# Patient Record
Sex: Male | Born: 1965 | Race: White | Hispanic: No | Marital: Married | Smoking: Never smoker
Health system: Southern US, Community
[De-identification: ages and names within clinical notes are randomized; demographics above are authoritative.]

## PROBLEM LIST (undated history)

## (undated) DIAGNOSIS — I10 Essential (primary) hypertension: Secondary | ICD-10-CM

## (undated) DIAGNOSIS — J329 Chronic sinusitis, unspecified: Secondary | ICD-10-CM

## (undated) DIAGNOSIS — T7840XA Allergy, unspecified, initial encounter: Secondary | ICD-10-CM

## (undated) DIAGNOSIS — E7800 Pure hypercholesterolemia, unspecified: Secondary | ICD-10-CM

## (undated) DIAGNOSIS — D649 Anemia, unspecified: Secondary | ICD-10-CM

## (undated) HISTORY — DX: Anemia, unspecified: D64.9

## (undated) HISTORY — PX: KNEE ARTHROSCOPY W/ MENISCAL REPAIR: SHX1877

## (undated) HISTORY — DX: Chronic sinusitis, unspecified: J32.9

## (undated) HISTORY — PX: OTHER SURGICAL HISTORY: SHX169

## (undated) HISTORY — DX: Allergy, unspecified, initial encounter: T78.40XA

---

## 2013-04-22 ENCOUNTER — Emergency Department (HOSPITAL_BASED_OUTPATIENT_CLINIC_OR_DEPARTMENT_OTHER)
Admission: EM | Admit: 2013-04-22 | Discharge: 2013-04-22 | Disposition: A | Discharge status: Home / Self Care | Payer: BC Managed Care – PPO | Attending: Emergency Medicine | Admitting: Emergency Medicine

## 2013-04-22 ENCOUNTER — Encounter (HOSPITAL_BASED_OUTPATIENT_CLINIC_OR_DEPARTMENT_OTHER): Payer: Self-pay | Admitting: Emergency Medicine

## 2013-04-22 ENCOUNTER — Emergency Department (HOSPITAL_BASED_OUTPATIENT_CLINIC_OR_DEPARTMENT_OTHER): Payer: BC Managed Care – PPO

## 2013-04-22 DIAGNOSIS — M436 Torticollis: Secondary | ICD-10-CM

## 2013-04-22 DIAGNOSIS — Z8639 Personal history of other endocrine, nutritional and metabolic disease: Secondary | ICD-10-CM | POA: Insufficient documentation

## 2013-04-22 DIAGNOSIS — Z79899 Other long term (current) drug therapy: Secondary | ICD-10-CM | POA: Insufficient documentation

## 2013-04-22 DIAGNOSIS — R51 Headache: Secondary | ICD-10-CM | POA: Insufficient documentation

## 2013-04-22 DIAGNOSIS — Z862 Personal history of diseases of the blood and blood-forming organs and certain disorders involving the immune mechanism: Secondary | ICD-10-CM | POA: Insufficient documentation

## 2013-04-22 DIAGNOSIS — I10 Essential (primary) hypertension: Secondary | ICD-10-CM | POA: Insufficient documentation

## 2013-04-22 HISTORY — DX: Pure hypercholesterolemia, unspecified: E78.00

## 2013-04-22 HISTORY — DX: Essential (primary) hypertension: I10

## 2013-04-22 LAB — BASIC METABOLIC PANEL
BUN: 15 mg/dL (ref 6–23)
CALCIUM: 9.6 mg/dL (ref 8.4–10.5)
CO2: 24 mEq/L (ref 19–32)
Chloride: 102 mEq/L (ref 96–112)
Creatinine, Ser: 0.9 mg/dL (ref 0.50–1.35)
Glucose, Bld: 120 mg/dL — ABNORMAL HIGH (ref 70–99)
POTASSIUM: 3.8 meq/L (ref 3.7–5.3)
Sodium: 141 mEq/L (ref 137–147)

## 2013-04-22 MED ORDER — LISINOPRIL 10 MG PO TABS
10.0000 mg | ORAL_TABLET | Freq: Once | ORAL | Status: AC
  Administered 2013-04-22: 10 mg via ORAL
  Filled 2013-04-22: qty 1

## 2013-04-22 MED ORDER — LISINOPRIL 20 MG PO TABS: 20.0000 mg | ORAL_TABLET | Freq: Every day | ORAL | Status: DC

## 2013-04-22 MED ORDER — METHOCARBAMOL 500 MG PO TABS: 500.0000 mg | ORAL_TABLET | Freq: Two times a day (BID) | ORAL | Status: DC

## 2013-04-22 MED ORDER — LISINOPRIL-HYDROCHLOROTHIAZIDE 20-12.5 MG PO TABS: 1.0000 | ORAL_TABLET | Freq: Two times a day (BID) | ORAL | Status: DC

## 2013-04-22 NOTE — Discharge Instructions (Signed)
DASH Diet  The DASH diet stands for "Dietary Approaches to Stop Hypertension." It is a healthy eating plan that has been shown to reduce high blood pressure (hypertension) in as little as 14 days, while also possibly providing other significant health benefits. These other health benefits include reducing the risk of breast cancer after menopause and reducing the risk of type 2 diabetes, heart disease, colon cancer, and stroke. Health benefits also include weight loss and slowing kidney failure in patients with chronic kidney disease.   DIET GUIDELINES  · Limit salt (sodium). Your diet should contain less than 1500 mg of sodium daily.  · Limit refined or processed carbohydrates. Your diet should include mostly whole grains. Desserts and added sugars should be used sparingly.  · Include small amounts of heart-healthy fats. These types of fats include nuts, oils, and tub margarine. Limit saturated and trans fats. These fats have been shown to be harmful in the body.  CHOOSING FOODS   The following food groups are based on a 2000 calorie diet. See your Registered Dietitian for individual calorie needs.  Grains and Grain Products (6 to 8 servings daily)  · Eat More Often: Whole-wheat bread, brown rice, whole-grain or wheat pasta, quinoa, popcorn without added fat or salt (air popped).  · Eat Less Often: White bread, white pasta, white rice, cornbread.  Vegetables (4 to 5 servings daily)  · Eat More Often: Fresh, frozen, and canned vegetables. Vegetables may be raw, steamed, roasted, or grilled with a minimal amount of fat.  · Eat Less Often/Avoid: Creamed or fried vegetables. Vegetables in a cheese sauce.  Fruit (4 to 5 servings daily)  · Eat More Often: All fresh, canned (in natural juice), or frozen fruits. Dried fruits without added sugar. One hundred percent fruit juice (½ cup [237 mL] daily).  · Eat Less Often: Dried fruits with added sugar. Canned fruit in light or heavy syrup.  Lean Meats, Fish, and Poultry (2  servings or less daily. One serving is 3 to 4 oz [85-114 g]).  · Eat More Often: Ninety percent or leaner ground beef, tenderloin, sirloin. Round cuts of beef, chicken breast, turkey breast. All fish. Grill, bake, or broil your meat. Nothing should be fried.  · Eat Less Often/Avoid: Fatty cuts of meat, turkey, or chicken leg, thigh, or wing. Fried cuts of meat or fish.  Dairy (2 to 3 servings)  · Eat More Often: Low-fat or fat-free milk, low-fat plain or light yogurt, reduced-fat or part-skim cheese.  · Eat Less Often/Avoid: Milk (whole, 2%). Whole milk yogurt. Full-fat cheeses.  Nuts, Seeds, and Legumes (4 to 5 servings per week)  · Eat More Often: All without added salt.  · Eat Less Often/Avoid: Salted nuts and seeds, canned beans with added salt.  Fats and Sweets (limited)  · Eat More Often: Vegetable oils, tub margarines without trans fats, sugar-free gelatin. Mayonnaise and salad dressings.  · Eat Less Often/Avoid: Coconut oils, palm oils, butter, stick margarine, cream, half and half, cookies, candy, pie.  FOR MORE INFORMATION  The Dash Diet Eating Plan: www.dashdiet.org  Document Released: 03/12/2011 Document Revised: 06/15/2011 Document Reviewed: 03/12/2011  [employer]® Patient Information ©2014 [employer].

## 2013-04-22 NOTE — ED Notes (Signed)
Pt states he has been out of his b/p meds x 45 days. Has had headache x 3 days

## 2013-04-22 NOTE — ED Provider Notes (Signed)
CSN: [id number]     Arrival date & time 04/22/13  1651 History   None    Chief Complaint  Patient presents with  . Headache  . Hypertension   (Consider location/radiation/quality/duration/timing/severity/associated sxs/prior Treatment) HPI William Shea is a 48 y.o. male who presents to the ED with elevated BP and a headache. He states that he has been out of his medication for over a month and has had a headache x 3 days. Pain started on the left side of his neck when he woke 3 days ago and has continued. He denies dizziness, nausea, vomiting, weakness or other problems.   Past Medical History  Diagnosis Date  . Hypertension   . High cholesterol    History reviewed. No pertinent past surgical history. No family history on file. History  Substance Use Topics  . Smoking status: Never Smoker   . Smokeless tobacco: Never Used  . Alcohol Use: 4.8 oz/week    8 Cans of beer per week    Review of Systems  Constitutional: Negative for fever and chills.  Eyes: Negative for redness and visual disturbance.  Respiratory: Negative for cough and shortness of breath.   Cardiovascular: Negative for chest pain.  Gastrointestinal: Negative for nausea, vomiting and abdominal pain.  Musculoskeletal: Positive for neck pain. Negative for myalgias and neck stiffness.  Skin: Negative for rash.  Neurological: Positive for headaches. Negative for dizziness, facial asymmetry and weakness.  Psychiatric/Behavioral: Negative for confusion. The patient is not nervous/anxious.     Allergies  Review of patient's allergies indicates no known allergies.  Home Medications   Current Outpatient Rx  Name  Route  Sig  Dispense  Refill  . lisinopril-hydrochlorothiazide (PRINZIDE,ZESTORETIC) 20-12.5 MG per tablet   Oral   Take 1 tablet by mouth 2 (two) times daily.          BP 168/108  Pulse 64  Temp(Src) 98 F (36.7 C) (Oral)  Resp 20  Ht 6' (1.829 m)  Wt 200 lb (90.719 kg)  BMI 27.12 kg/m2  SpO2  98% Physical Exam  Nursing note and vitals reviewed. Constitutional: He is oriented to person, place, and time. He appears well-developed and well-nourished.  Patient appears healthy, alert and in no distress.  HENT:  Head: Normocephalic and atraumatic.  Right Ear: Tympanic membrane normal.  Left Ear: Tympanic membrane normal.  Nose: Nose normal.  Mouth/Throat: Uvula is midline, oropharynx is clear and moist and mucous membranes are normal.  Eyes: EOM are normal.  Neck: Trachea normal and normal range of motion. Neck supple. No JVD present. Muscular tenderness present. No rigidity. Normal range of motion present.    Muscular tenderness left side of neck with palpation and range of motion. Muscle spasm noted.  Cardiovascular: Normal rate, regular rhythm and normal heart sounds.   Pulmonary/Chest: Effort normal and breath sounds normal.  Abdominal: Soft. There is no tenderness.  Musculoskeletal: Normal range of motion.  Neurological: He is alert and oriented to person, place, and time. He has normal strength and normal reflexes. No cranial nerve deficit or sensory deficit. He displays a negative Romberg sign. Coordination and gait normal.  Skin: Skin is warm and dry.  Psychiatric: He has a normal mood and affect. His behavior is normal.       Dr. Wyvonnia Dusky in to see the patient and request labs and Head CT. If normal will give Rx for his HTN and resource information so he can get established with a PCP.    Results for orders  placed during the hospital encounter of 04/22/13 (from the past 24 hour(s))  BASIC METABOLIC PANEL     Status: Abnormal   Collection Time    04/22/13  6:20 PM      Result Value Range   Sodium 141  137 - 147 mEq/L   Potassium 3.8  3.7 - 5.3 mEq/L   Chloride 102  96 - 112 mEq/L   CO2 24  19 - 32 mEq/L   Glucose, Bld 120 (*) 70 - 99 mg/dL   BUN 15  6 - 23 mg/dL   Creatinine, Ser 0.90  0.50 - 1.35 mg/dL   Calcium 9.6  8.4 - 10.5 mg/dL   GFR calc non Af Amer >90   >90 mL/min   GFR calc Af Amer >90  >90 mL/min    Ct Head Wo Contrast  04/22/2013   CLINICAL DATA:  HEADACHE HYPERTENSION HEADACHE HYPERTENSION  EXAM: CT HEAD WITHOUT CONTRAST  TECHNIQUE: Contiguous axial images were obtained from the base of the skull through the vertex without intravenous contrast.  COMPARISON:  None.  FINDINGS: No acute intracranial hemorrhage. No focal mass lesion. No CT evidence of acute infarction. No midline shift or mass effect. No hydrocephalus. Basilar cisterns are patent. Paranasal sinuses and mastoid air cells are clear.  IMPRESSION: Normal head CT.   Electronically Signed   By: Suzy Bouchard M.D.   On: 04/22/2013 19:27    ED Course  Procedures Lisinopril dose given in the ED  MDM  48 y.o. male with headache x 3 days and elevated BP and muscular pain of left side of his neck. Out of medication x 45 days. Will refill medications and treat torticollis. Patient given resource information to find a PCP. I have reviewed this patient's vital signs, nurses notes, appropriate labs and imaging.  I have discussed findings with the patient and plan of care. Discussed complications associated with untreated hypertension. He voices understanding.  Stable for discharge with vital signs repeated and BP improved. BP 172/105  Pulse 61  Temp(Src) 97.7 F (36.5 C) (Oral)  Resp 16  Ht 6' (1.829 m)  Wt 200 lb (90.719 kg)  BMI 27.12 kg/m2  SpO2 99%    Medication List         lisinopril-hydrochlorothiazide 20-12.5 MG per tablet  Commonly known as:  PRINZIDE,ZESTORETIC  Take 1 tablet by mouth 2 (two) times daily.     methocarbamol 500 MG tablet  Commonly known as:  ROBAXIN  Take 1 tablet (500 mg total) by mouth 2 (two) times daily.           [place], [place] 04/23/13 2146

## 2013-04-23 NOTE — ED Provider Notes (Signed)
Medical screening examination/treatment/procedure(s) were conducted as a shared visit with non-physician practitioner(s) and myself.  I personally evaluated the patient during the encounter.  Gradual onset headache for the past 3 days. Out of pressure medications for the past month. No chest pain or shortness of breath.  CN 2-12 intact, no ataxia on finger to nose, no nystagmus, 5/5 strength throughout, no pronator drift, Romberg negative, normal gait.   EKG Interpretation   None          Ezequiel Essex, MD 04/23/13 2351

## 2013-07-05 ENCOUNTER — Ambulatory Visit: Payer: BC Managed Care – PPO | Admitting: Physician Assistant

## 2013-07-14 ENCOUNTER — Ambulatory Visit: Payer: BC Managed Care – PPO | Admitting: Physician Assistant

## 2013-07-19 ENCOUNTER — Telehealth: Payer: Self-pay | Admitting: Physician Assistant

## 2013-07-19 ENCOUNTER — Encounter: Payer: Self-pay | Admitting: Physician Assistant

## 2013-07-19 ENCOUNTER — Ambulatory Visit (INDEPENDENT_AMBULATORY_CARE_PROVIDER_SITE_OTHER): Payer: BC Managed Care – PPO | Admitting: Physician Assistant

## 2013-07-19 VITALS — BP 128/94 | HR 76 | Temp 98.2°F | Resp 16 | Ht 72.0 in | Wt 208.2 lb

## 2013-07-19 DIAGNOSIS — I10 Essential (primary) hypertension: Secondary | ICD-10-CM | POA: Insufficient documentation

## 2013-07-19 DIAGNOSIS — F411 Generalized anxiety disorder: Secondary | ICD-10-CM | POA: Insufficient documentation

## 2013-07-19 DIAGNOSIS — Z0000 Encounter for general adult medical examination without abnormal findings: Secondary | ICD-10-CM | POA: Insufficient documentation

## 2013-07-19 DIAGNOSIS — Z23 Encounter for immunization: Secondary | ICD-10-CM

## 2013-07-19 HISTORY — DX: Encounter for general adult medical examination without abnormal findings: Z00.00

## 2013-07-19 LAB — TSH: TSH: 1.745 u[IU]/mL (ref 0.350–4.500)

## 2013-07-19 LAB — HEPATIC FUNCTION PANEL
ALK PHOS: 91 U/L (ref 39–117)
ALT: 112 U/L — ABNORMAL HIGH (ref 0–53)
AST: 87 U/L — ABNORMAL HIGH (ref 0–37)
Albumin: 4.9 g/dL (ref 3.5–5.2)
BILIRUBIN INDIRECT: 0.5 mg/dL (ref 0.2–1.2)
Bilirubin, Direct: 0.1 mg/dL (ref 0.0–0.3)
TOTAL PROTEIN: 7.5 g/dL (ref 6.0–8.3)
Total Bilirubin: 0.6 mg/dL (ref 0.2–1.2)

## 2013-07-19 LAB — CBC WITH DIFFERENTIAL/PLATELET
Basophils Absolute: 0.1 10*3/uL (ref 0.0–0.1)
Basophils Relative: 1 % (ref 0–1)
EOS ABS: 0.4 10*3/uL (ref 0.0–0.7)
Eosinophils Relative: 5 % (ref 0–5)
HCT: 44.7 % (ref 39.0–52.0)
Hemoglobin: 16.1 g/dL (ref 13.0–17.0)
LYMPHS ABS: 1.9 10*3/uL (ref 0.7–4.0)
Lymphocytes Relative: 25 % (ref 12–46)
MCH: 31 pg (ref 26.0–34.0)
MCHC: 36 g/dL (ref 30.0–36.0)
MCV: 86.1 fL (ref 78.0–100.0)
Monocytes Absolute: 1 10*3/uL (ref 0.1–1.0)
Monocytes Relative: 13 % — ABNORMAL HIGH (ref 3–12)
NEUTROS ABS: 4.2 10*3/uL (ref 1.7–7.7)
NEUTROS PCT: 56 % (ref 43–77)
Platelets: 344 10*3/uL (ref 150–400)
RBC: 5.19 MIL/uL (ref 4.22–5.81)
RDW: 13.7 % (ref 11.5–15.5)
WBC: 7.5 10*3/uL (ref 4.0–10.5)

## 2013-07-19 LAB — HEMOGLOBIN A1C
Hgb A1c MFr Bld: 6.2 % — ABNORMAL HIGH (ref <5.7)
Mean Plasma Glucose: 131 mg/dL — ABNORMAL HIGH (ref <117)

## 2013-07-19 LAB — LIPID PANEL
CHOLESTEROL: 325 mg/dL — AB (ref 0–200)
HDL: 49 mg/dL (ref >39)
LDL Cholesterol: 208 mg/dL — ABNORMAL HIGH (ref 0–99)
TRIGLYCERIDES: 339 mg/dL — AB (ref <150)
Total CHOL/HDL Ratio: 6.6 Ratio
VLDL: 68 mg/dL — ABNORMAL HIGH (ref 0–40)

## 2013-07-19 LAB — BASIC METABOLIC PANEL
BUN: 12 mg/dL (ref 6–23)
CO2: 30 meq/L (ref 19–32)
Calcium: 9.8 mg/dL (ref 8.4–10.5)
Chloride: 95 mEq/L — ABNORMAL LOW (ref 96–112)
Creat: 0.88 mg/dL (ref 0.50–1.35)
Glucose, Bld: 113 mg/dL — ABNORMAL HIGH (ref 70–99)
POTASSIUM: 4.6 meq/L (ref 3.5–5.3)
SODIUM: 132 meq/L — AB (ref 135–145)

## 2013-07-19 MED ORDER — ESCITALOPRAM OXALATE 20 MG PO TABS: ORAL_TABLET | ORAL | Status: DC

## 2013-07-19 MED ORDER — LISINOPRIL-HYDROCHLOROTHIAZIDE 20-12.5 MG PO TABS: 1.0000 | ORAL_TABLET | Freq: Two times a day (BID) | ORAL | Status: DC

## 2013-07-19 NOTE — Patient Instructions (Signed)
Please obtain labs.  I will call you with your results.  Continue blood pressure medication as directed.  For irritability, please start Lexapro -- 1/2 tablet daily for 1 week.  Then increase to 1 tablet daily.  Follow-up in 1 month.  Return sooner if you need anything.  Preventive Care for Adults, Male A healthy lifestyle and preventive care can promote health and wellness. Preventive health guidelines for men include the following key practices:  A routine yearly physical is a good way to check with your health care provider about your health and preventative screening. It is a chance to share any concerns and updates on your health and to receive a thorough exam.  Visit your dentist for a routine exam and preventative care every 6 months. Brush your teeth twice a day and floss once a day. Good oral hygiene prevents tooth decay and gum disease.  The frequency of eye exams is based on your age, health, family medical history, use of contact lenses, and other factors. Follow your health care provider's recommendations for frequency of eye exams.  Eat a healthy diet. Foods such as vegetables, fruits, whole grains, low-fat dairy products, and lean protein foods contain the nutrients you need without too many calories. Decrease your intake of foods high in solid fats, added sugars, and salt. Eat the right amount of calories for you.Get information about a proper diet from your health care provider, if necessary.  Regular physical exercise is one of the most important things you can do for your health. Most adults should get at least 150 minutes of moderate-intensity exercise (any activity that increases your heart rate and causes you to sweat) each week. In addition, most adults need muscle-strengthening exercises on 2 or more days a week.  Maintain a healthy weight. The body mass index (BMI) is a screening tool to identify possible weight problems. It provides an estimate of body fat based on height  and weight. Your health care provider can find your BMI and can help you achieve or maintain a healthy weight.For adults 20 years and older:  A BMI below 18.5 is considered underweight.  A BMI of 18.5 to 24.9 is normal.  A BMI of 25 to 29.9 is considered overweight.  A BMI of 30 and above is considered obese.  Maintain normal blood lipids and cholesterol levels by exercising and minimizing your intake of saturated fat. Eat a balanced diet with plenty of fruit and vegetables. Blood tests for lipids and cholesterol should begin at age 20 and be repeated every 5 years. If your lipid or cholesterol levels are high, you are over 50, or you are at high risk for heart disease, you may need your cholesterol levels checked more frequently.Ongoing high lipid and cholesterol levels should be treated with medicines if diet and exercise are not working.  If you smoke, find out from your health care provider how to quit. If you do not use tobacco, do not start.  Lung cancer screening is recommended for adults aged 52 80 years who are at high risk for developing lung cancer because of a history of smoking. A yearly low-dose CT scan of the lungs is recommended for people who have at least a 30-pack-year history of smoking and are a current smoker or have quit within the past 15 years. A pack year of smoking is smoking an average of 1 pack of cigarettes a day for 1 year (for example: 1 pack a day for 30 years or 2 packs  a day for 15 years). Yearly screening should continue until the smoker has stopped smoking for at least 15 years. Yearly screening should be stopped for people who develop a health problem that would prevent them from having lung cancer treatment.  If you choose to drink alcohol, do not have more than 2 drinks per day. One drink is considered to be 12 ounces (355 mL) of beer, 5 ounces (148 mL) of wine, or 1.5 ounces (44 mL) of liquor.  Avoid use of street drugs. Do not share needles with anyone.  Ask for help if you need support or instructions about stopping the use of drugs.  High blood pressure causes heart disease and increases the risk of stroke. Your blood pressure should be checked at least every 1 2 years. Ongoing high blood pressure should be treated with medicines, if weight loss and exercise are not effective.  If you are 28 50 years old, ask your health care provider if you should take aspirin to prevent heart disease.  Diabetes screening involves taking a blood sample to check your fasting blood sugar level. This should be done once every 3 years, after age 39, if you are within normal weight and without risk factors for diabetes. Testing should be considered at a younger age or be carried out more frequently if you are overweight and have at least 1 risk factor for diabetes.  Colorectal cancer can be detected and often prevented. Most routine colorectal cancer screening begins at the age of 8 and continues through age 29. However, your health care provider may recommend screening at an earlier age if you have risk factors for colon cancer. On a yearly basis, your health care provider may provide home test kits to check for hidden blood in the stool. Use of a small camera at the end of a tube to directly examine the colon (sigmoidoscopy or colonoscopy) can detect the earliest forms of colorectal cancer. Talk to your health care provider about this at age 19, when routine screening begins. Direct exam of the colon should be repeated every 5 10 years through age 45, unless early forms of precancerous polyps or small growths are found.  People who are at an increased risk for hepatitis B should be screened for this virus. You are considered at high risk for hepatitis B if:  You were born in a country where hepatitis B occurs often. Talk with your health care provider about which countries are considered high-risk.  Your parents were born in a high-risk country and you have not  received a shot to protect against hepatitis B (hepatitis B vaccine).  You have HIV or AIDS.  You use needles to inject street drugs.  You live with, or have sex with, someone who has hepatitis B.  You are a man who has sex with other men (MSM).  You get hemodialysis treatment.  You take certain medicines for conditions such as cancer, organ transplantation, and autoimmune conditions.  Hepatitis C blood testing is recommended for all people born from [birth year] through 1965 and any individual with known risks for hepatitis C.  Practice safe sex. Use condoms and avoid high-risk sexual practices to reduce the spread of sexually transmitted infections (STIs). STIs include gonorrhea, chlamydia, syphilis, trichomonas, herpes, HPV, and human immunodeficiency virus (HIV). Herpes, HIV, and HPV are viral illnesses that have no cure. They can result in disability, cancer, and death.  A one-time screening for abdominal aortic aneurysm (AAA) and surgical repair of large AAAs by  ultrasound are recommended for men ages 53 to 49 years who are current or former smokers.  Healthy men should no longer receive prostate-specific antigen (PSA) blood tests as part of routine cancer screening. Talk with your health care provider about prostate cancer screening.  Testicular cancer screening is not recommended for adult males who have no symptoms. Screening includes self-exam, a health care provider exam, and other screening tests. Consult with your health care provider about any symptoms you have or any concerns you have about testicular cancer.  Use sunscreen. Apply sunscreen liberally and repeatedly throughout the day. You should seek shade when your shadow is shorter than you. Protect yourself by wearing long sleeves, pants, a wide-brimmed hat, and sunglasses year round, whenever you are outdoors.  Once a month, do a whole-body skin exam, using a mirror to look at the skin on your back. Tell your health care provider  about new moles, moles that have irregular borders, moles that are larger than a pencil eraser, or moles that have changed in shape or color.  Stay current with required vaccines (immunizations).  Influenza vaccine. All adults should be immunized every year.  Tetanus, diphtheria, and acellular pertussis (Td, Tdap) vaccine. An adult who has not previously received Tdap or who does not know his vaccine status should receive 1 dose of Tdap. This initial dose should be followed by tetanus and diphtheria toxoids (Td) booster doses every 10 years. Adults with an unknown or incomplete history of completing a 3-dose immunization series with Td-containing vaccines should begin or complete a primary immunization series including a Tdap dose. Adults should receive a Td booster every 10 years.  Varicella vaccine. An adult without evidence of immunity to varicella should receive 2 doses or a second dose if he has previously received 1 dose.  Human papillomavirus (HPV) vaccine. Males aged 71 21 years who have not received the vaccine previously should receive the 3-dose series. Males aged 6 26 years may be immunized. Immunization is recommended through the age of 24 years for any male who has sex with males and did not get any or all doses earlier. Immunization is recommended for any person with an immunocompromised condition through the age of 66 years if he did not get any or all doses earlier. During the 3-dose series, the second dose should be obtained 4 8 weeks after the first dose. The third dose should be obtained 24 weeks after the first dose and 16 weeks after the second dose.  Zoster vaccine. One dose is recommended for adults aged 29 years or older unless certain conditions are present.  Measles, mumps, and rubella (MMR) vaccine. Adults born before [birth year] generally are considered immune to measles and mumps. Adults born in [birth year] or later should have 1 or more doses of MMR vaccine unless there is a  contraindication to the vaccine or there is laboratory evidence of immunity to each of the three diseases. A routine second dose of MMR vaccine should be obtained at least 28 days after the first dose for students attending postsecondary schools, health care workers, or international travelers. People who received inactivated measles vaccine or an unknown type of measles vaccine during 1963 1967 should receive 2 doses of MMR vaccine. People who received inactivated mumps vaccine or an unknown type of mumps vaccine before 1979 and are at high risk for mumps infection should consider immunization with 2 doses of MMR vaccine. Unvaccinated health care workers born before 1957 who lack laboratory evidence of measles, mumps, or  rubella immunity or laboratory confirmation of disease should consider measles and mumps immunization with 2 doses of MMR vaccine or rubella immunization with 1 dose of MMR vaccine.  Pneumococcal 13-valent conjugate (PCV13) vaccine. When indicated, a person who is uncertain of his immunization history and has no record of immunization should receive the PCV13 vaccine. An adult aged 54 years or older who has certain medical conditions and has not been previously immunized should receive 1 dose of PCV13 vaccine. This PCV13 should be followed with a dose of pneumococcal polysaccharide (PPSV23) vaccine. The PPSV23 vaccine dose should be obtained at least 8 weeks after the dose of PCV13 vaccine. An adult aged 53 years or older who has certain medical conditions and previously received 1 or more doses of PPSV23 vaccine should receive 1 dose of PCV13. The PCV13 vaccine dose should be obtained 1 or more years after the last PPSV23 vaccine dose.  Pneumococcal polysaccharide (PPSV23) vaccine. When PCV13 is also indicated, PCV13 should be obtained first. All adults aged 32 years and older should be immunized. An adult younger than age 90 years who has certain medical conditions should be immunized. Any  person who resides in a nursing home or long-term care facility should be immunized. An adult smoker should be immunized. People with an immunocompromised condition and certain other conditions should receive both PCV13 and PPSV23 vaccines. People with human immunodeficiency virus (HIV) infection should be immunized as soon as possible after diagnosis. Immunization during chemotherapy or radiation therapy should be avoided. Routine use of PPSV23 vaccine is not recommended for American Indians, [place] Natives, or people younger than 65 years unless there are medical conditions that require PPSV23 vaccine. When indicated, people who have unknown immunization and have no record of immunization should receive PPSV23 vaccine. One-time revaccination 5 years after the first dose of PPSV23 is recommended for people aged 40 64 years who have chronic kidney failure, nephrotic syndrome, asplenia, or immunocompromised conditions. People who received 1 2 doses of PPSV23 before age 44 years should receive another dose of PPSV23 vaccine at age 16 years or later if at least 5 years have passed since the previous dose. Doses of PPSV23 are not needed for people immunized with PPSV23 at or after age 19 years.  Meningococcal vaccine. Adults with asplenia or persistent complement component deficiencies should receive 2 doses of quadrivalent meningococcal conjugate (MenACWY-D) vaccine. The doses should be obtained at least 2 months apart. Microbiologists working with certain meningococcal bacteria, [employer] recruits, people at risk during an outbreak, and people who travel to or live in countries with a high rate of meningitis should be immunized. A first-year college student up through age 17 years who is living in a residence hall should receive a dose if he did not receive a dose on or after his 16th birthday. Adults who have certain high-risk conditions should receive one or more doses of vaccine.  Hepatitis A vaccine. Adults who  wish to be protected from this disease, have certain high-risk conditions, work with hepatitis A-infected animals, work in hepatitis A research labs, or travel to or work in countries with a high rate of hepatitis A should be immunized. Adults who were previously unvaccinated and who anticipate close contact with an international adoptee during the first 60 days after arrival in the [place] from a country with a high rate of hepatitis A should be immunized.  Hepatitis B vaccine. Adults who wish to be protected from this disease, have certain high-risk conditions, may be exposed  to blood or other infectious body fluids, are household contacts or sex partners of hepatitis B positive people, are clients or workers in certain care facilities, or travel to or work in countries with a high rate of hepatitis B should be immunized.  Haemophilus influenzae type b (Hib) vaccine. A previously unvaccinated person with asplenia or sickle cell disease or having a scheduled splenectomy should receive 1 dose of Hib vaccine. Regardless of previous immunization, a recipient of a hematopoietic stem cell transplant should receive a 3-dose series 6 12 months after his successful transplant. Hib vaccine is not recommended for adults with HIV infection. Preventive Service / Frequency Ages 52 to 64  Blood pressure check.** / Every 1 to 2 years.  Lipid and cholesterol check.** / Every 5 years beginning at age 51.  Hepatitis C blood test.** / For any individual with known risks for hepatitis C.  Skin self-exam. / Monthly.  Influenza vaccine. / Every year.  Tetanus, diphtheria, and acellular pertussis (Tdap, Td) vaccine.** / Consult your health care provider. 1 dose of Td every 10 years.  Varicella vaccine.** / Consult your health care provider.  HPV vaccine. / 3 doses over 6 months, if 70 or younger.  Measles, mumps, rubella (MMR) vaccine.** / You need at least 1 dose of MMR if you were born in 1957 or later.  You may also need a second dose.  Pneumococcal 13-valent conjugate (PCV13) vaccine.** / Consult your health care provider.  Pneumococcal polysaccharide (PPSV23) vaccine.** / 1 to 2 doses if you smoke cigarettes or if you have certain conditions.  Meningococcal vaccine.** / 1 dose if you are age 32 to 69 years and a Market researcher living in a residence hall, or have one of several medical conditions. You may also need additional booster doses.  Hepatitis A vaccine.** / Consult your health care provider.  Hepatitis B vaccine.** / Consult your health care provider.  Haemophilus influenzae type b (Hib) vaccine.** / Consult your health care provider. Ages 44 to 68  Blood pressure check.** / Every 1 to 2 years.  Lipid and cholesterol check.** / Every 5 years beginning at age 55.  Lung cancer screening. / Every year if you are aged 54 80 years and have a 30-pack-year history of smoking and currently smoke or have quit within the past 15 years. Yearly screening is stopped once you have quit smoking for at least 15 years or develop a health problem that would prevent you from having lung cancer treatment.  Fecal occult blood test (FOBT) of stool. / Every year beginning at age 37 and continuing until age 41. You may not have to do this test if you get a colonoscopy every 10 years.  Flexible sigmoidoscopy** or colonoscopy.** / Every 5 years for a flexible sigmoidoscopy or every 10 years for a colonoscopy beginning at age 22 and continuing until age 47.  Hepatitis C blood test.** / For all people born from [birth year] through 1967 and any individual with known risks for hepatitis C.  Skin self-exam. / Monthly.  Influenza vaccine. / Every year.  Tetanus, diphtheria, and acellular pertussis (Tdap/Td) vaccine.** / Consult your health care provider. 1 dose of Td every 10 years.  Varicella vaccine.** / Consult your health care provider.  Zoster vaccine.** / 1 dose for adults aged 60 years or  older.  Measles, mumps, rubella (MMR) vaccine.** / You need at least 1 dose of MMR if you were born in 1957 or later. You may also need a second  dose.  Pneumococcal 13-valent conjugate (PCV13) vaccine.** / Consult your health care provider.  Pneumococcal polysaccharide (PPSV23) vaccine.** / 1 to 2 doses if you smoke cigarettes or if you have certain conditions.  Meningococcal vaccine.** / Consult your health care provider.  Hepatitis A vaccine.** / Consult your health care provider.  Hepatitis B vaccine.** / Consult your health care provider.  Haemophilus influenzae type b (Hib) vaccine.** / Consult your health care provider. Ages 59 and over  Blood pressure check.** / Every 1 to 2 years.  Lipid and cholesterol check.**/ Every 5 years beginning at age 32.  Lung cancer screening. / Every year if you are aged 48 80 years and have a 30-pack-year history of smoking and currently smoke or have quit within the past 15 years. Yearly screening is stopped once you have quit smoking for at least 15 years or develop a health problem that would prevent you from having lung cancer treatment.  Fecal occult blood test (FOBT) of stool. / Every year beginning at age 18 and continuing until age 48. You may not have to do this test if you get a colonoscopy every 10 years.  Flexible sigmoidoscopy** or colonoscopy.** / Every 5 years for a flexible sigmoidoscopy or every 10 years for a colonoscopy beginning at age 4 and continuing until age 55.  Hepatitis C blood test.** / For all people born from [birth year] through 1967 and any individual with known risks for hepatitis C.  Abdominal aortic aneurysm (AAA) screening.** / A one-time screening for ages 39 to 37 years who are current or former smokers.  Skin self-exam. / Monthly.  Influenza vaccine. / Every year.  Tetanus, diphtheria, and acellular pertussis (Tdap/Td) vaccine.** / 1 dose of Td every 10 years.  Varicella vaccine.** / Consult your health care  provider.  Zoster vaccine.** / 1 dose for adults aged 46 years or older.  Pneumococcal 13-valent conjugate (PCV13) vaccine.** / Consult your health care provider.  Pneumococcal polysaccharide (PPSV23) vaccine.** / 1 dose for all adults aged 22 years and older.  Meningococcal vaccine.** / Consult your health care provider.  Hepatitis A vaccine.** / Consult your health care provider.  Hepatitis B vaccine.** / Consult your health care provider.  Haemophilus influenzae type b (Hib) vaccine.** / Consult your health care provider. **Family history and personal history of risk and conditions may change your health care provider's recommendations. Document Released: 05/19/2001 Document Revised: 01/11/2013 Document Reviewed: 08/18/2010 [facility] Patient Information 2014 [place], [place].  Hypertension As your heart beats, it forces blood through your arteries. This force is your blood pressure. If the pressure is too high, it is called hypertension (HTN) or high blood pressure. HTN is dangerous because you may have it and not know it. High blood pressure may mean that your heart has to work harder to pump blood. Your arteries may be narrow or stiff. The extra work puts you at risk for heart disease, stroke, and other problems.  Blood pressure consists of two numbers, a higher number over a lower, 110/72, for example. It is stated as "110 over 72." The ideal is below 120 for the top number (systolic) and under 80 for the bottom (diastolic). Write down your blood pressure today. You should pay close attention to your blood pressure if you have certain conditions such as:  Heart failure.  Prior heart attack.  Diabetes  Chronic kidney disease.  Prior stroke.  Multiple risk factors for heart disease. To see if you have HTN, your blood pressure should be measured  while you are seated with your arm held at the level of the heart. It should be measured at least twice. A one-time elevated blood  pressure reading (especially in the Emergency Department) does not mean that you need treatment. There may be conditions in which the blood pressure is different between your right and left arms. It is important to see your caregiver soon for a recheck. Most people have essential hypertension which means that there is not a specific cause. This type of high blood pressure may be lowered by changing lifestyle factors such as:  Stress.  Smoking.  Lack of exercise.  Excessive weight.  Drug/tobacco/alcohol use.  Eating less salt. Most people do not have symptoms from high blood pressure until it has caused damage to the body. Effective treatment can often prevent, delay or reduce that damage. TREATMENT  When a cause has been identified, treatment for high blood pressure is directed at the cause. There are a large number of medications to treat HTN. These fall into several categories, and your caregiver will help you select the medicines that are best for you. Medications may have side effects. You should review side effects with your caregiver. If your blood pressure stays high after you have made lifestyle changes or started on medicines,   Your medication(s) may need to be changed.  Other problems may need to be addressed.  Be certain you understand your prescriptions, and know how and when to take your medicine.  Be sure to follow up with your caregiver within the time frame advised (usually within two weeks) to have your blood pressure rechecked and to review your medications.  If you are taking more than one medicine to lower your blood pressure, make sure you know how and at what times they should be taken. Taking two medicines at the same time can result in blood pressure that is too low. SEEK IMMEDIATE MEDICAL CARE IF:  You develop a severe headache, blurred or changing vision, or confusion.  You have unusual weakness or numbness, or a faint feeling.  You have severe chest or  abdominal pain, vomiting, or breathing problems. MAKE SURE YOU:   Understand these instructions.  Will watch your condition.  Will get help right away if you are not doing well or get worse. Document Released: 03/23/2005 Document Revised: 06/15/2011 Document Reviewed: 11/11/2007 [facility] Patient Information 2014 [place].

## 2013-07-19 NOTE — Assessment & Plan Note (Signed)
History reviewed.  Will obtain fasting labs.  Will be due for Colonoscopy in June of this year.  DTP given by nursing.

## 2013-07-19 NOTE — Progress Notes (Signed)
Patient presents to clinic today to establish care.  Acute Concerns: Anxiety/Stress -- Denies depressed mood or anhedonia.  Occasional chest tightness with anxiety.  Denies panic attacks.  Denies suicidal thought or ideation.  Some irritability when feeling anxious.  Has never taken medication for anxiety. DBP is elevated at today's visit.  Chronic Issues: Hypertension -- Chronic diagnosis.  Has been on Lisinopril-HCTZ.  Denies chronic cough.  Denies chest pain, palpitations, vision changes, headaches, SOB, lightheadedness or dizziness.  Health Maintenance: Dental -- UTD Vision -- Overdue; some gradual blurring of vision; has upcoming appointment. Immunizations -- Tetanus overdue.   Colonoscopy -- Due for Colonoscopy this year.  Past Medical History  Diagnosis Date  . Hypertension   . High cholesterol     Past Surgical History  Procedure Laterality Date  . Unremarkable      Current Outpatient Prescriptions on File Prior to Visit  Medication Sig Dispense Refill  . [DISCONTINUED] lisinopril (PRINIVIL,ZESTRIL) 20 MG tablet Take 1 tablet (20 mg total) by mouth daily.  30 tablet  0   No current facility-administered medications on file prior to visit.    No Known Allergies  Family History  Problem Relation Age of Onset  . Hypertension Mother     Living  . Thyroid disease Mother   . Diabetes Father 37    Deceased  . Obesity Father   . Heart attack Father   . Diabetes Other     Paternal Side of Family  . Alzheimer's disease Maternal Grandmother   . Diabetes Sister   . Healthy Son     x3   History   Social History  . Marital Status: Married    Spouse Name: N/A    Number of Children: N/A  . Years of Education: N/A   Occupational History  . Not on file.   Social History Main Topics  . Smoking status: Never Smoker   . Smokeless tobacco: Never Used  . Alcohol Use: 6.0 oz/week    10 Shots of liquor per week  . Drug Use: No  . Sexual Activity: Not on file    Other Topics Concern  . Not on file   Social History Narrative  . No narrative on file   Review of Systems  Constitutional: Negative for fever and weight loss.  HENT: Negative for ear discharge, ear pain, hearing loss and tinnitus.   Eyes: Positive for blurred vision. Negative for double vision, photophobia and pain.  Respiratory: Negative for cough and shortness of breath.   Cardiovascular: Negative for chest pain and palpitations.  Gastrointestinal: Negative for heartburn, nausea, vomiting, abdominal pain, diarrhea, constipation, blood in stool and melena.  Genitourinary: Negative for dysuria, urgency, frequency, hematuria and flank pain.       Nocturia x 1.    Neurological: Negative for dizziness, loss of consciousness and headaches.  Endo/Heme/Allergies: Negative for environmental allergies.  Psychiatric/Behavioral: Negative for depression, suicidal ideas, hallucinations and substance abuse. The patient is nervous/anxious. The patient does not have insomnia.    BP 128/94  Pulse 76  Temp(Src) 98.2 F (36.8 C) (Oral)  Resp 16  Ht 6' (1.829 m)  Wt 208 lb 4 oz (94.462 kg)  BMI 28.24 kg/m2  SpO2 98%  Physical Exam  Vitals reviewed. Constitutional: He is oriented to person, place, and time and well-developed, well-nourished, and in no distress.  HENT:  Head: Normocephalic and atraumatic.  Right Ear: External ear normal.  Left Ear: External ear normal.  Nose: Nose normal.  Mouth/Throat: Oropharynx  is clear and moist. No oropharyngeal exudate.  Eyes: Conjunctivae and EOM are normal. Pupils are equal, round, and reactive to light.  Neck: Neck supple. No thyromegaly present.  Cardiovascular: Normal rate, regular rhythm, normal heart sounds and intact distal pulses.   No murmur heard. Pulmonary/Chest: Effort normal and breath sounds normal. No respiratory distress. He has no wheezes. He has no rales. He exhibits no tenderness.  Abdominal: Soft. Bowel sounds are normal. He  exhibits no distension and no mass. There is no tenderness. There is no rebound and no guarding.  Lymphadenopathy:    He has no cervical adenopathy.  Neurological: He is alert and oriented to person, place, and time.  Skin: Skin is warm and dry. No rash noted.  Psychiatric: Mood, memory, affect and judgment normal.   Recent Results (from the past 2160 hour(s))  BASIC METABOLIC PANEL     Status: Abnormal   Collection Time    04/22/13  6:20 PM      Result Value Ref Range   Sodium 141  137 - 147 mEq/L   Potassium 3.8  3.7 - 5.3 mEq/L   Chloride 102  96 - 112 mEq/L   CO2 24  19 - 32 mEq/L   Glucose, Bld 120 (*) 70 - 99 mg/dL   BUN 15  6 - 23 mg/dL   Creatinine, Ser 0.90  0.50 - 1.35 mg/dL   Calcium 9.6  8.4 - 10.5 mg/dL   GFR calc non Af Amer >90  >90 mL/min   GFR calc Af Amer >90  >90 mL/min   Comment: (NOTE)     The eGFR has been calculated using the CKD EPI equation.     This calculation has not been validated in all clinical situations.     eGFR's persistently <90 mL/min signify possible Chronic Kidney     Disease.   Assessment/Plan: Visit for preventive health examination History reviewed.  Will obtain fasting labs.  Will be due for Colonoscopy in June of this year.  DTP given by nursing.  Hypertension Continue current regimen.  Medication refilled.  Generalized anxiety disorder Rx Lexapro -- 10 mg x 1 week.  Increase to 20 mg daily if tolerated.  Follow-up in 3-4 weeks.  Also obtaining TSH level.

## 2013-07-19 NOTE — Assessment & Plan Note (Signed)
Continue current regimen.  Medication refilled.

## 2013-07-19 NOTE — Assessment & Plan Note (Signed)
Rx Lexapro -- 10 mg x 1 week.  Increase to 20 mg daily if tolerated.  Follow-up in 3-4 weeks.  Also obtaining TSH level.

## 2013-07-19 NOTE — Progress Notes (Signed)
Pre visit review using our clinic review tool, if applicable. No additional management support is needed unless otherwise documented below in the visit note/SLS  

## 2013-07-19 NOTE — Telephone Encounter (Signed)
Relevant patient education assigned to patient using Emmi. ° °

## 2013-07-20 LAB — URINALYSIS, ROUTINE W REFLEX MICROSCOPIC
Bilirubin Urine: NEGATIVE
Glucose, UA: NEGATIVE mg/dL
Hgb urine dipstick: NEGATIVE
Ketones, ur: NEGATIVE mg/dL
Leukocytes, UA: NEGATIVE
NITRITE: NEGATIVE
Protein, ur: NEGATIVE mg/dL
SPECIFIC GRAVITY, URINE: 1.016 (ref 1.005–1.030)
Urobilinogen, UA: 0.2 mg/dL (ref 0.0–1.0)
pH: 6 (ref 5.0–8.0)

## 2013-07-20 LAB — PSA: PSA: 0.56 ng/mL (ref <=4.00)

## 2013-07-21 ENCOUNTER — Telehealth: Payer: Self-pay | Admitting: *Deleted

## 2013-07-21 NOTE — Telephone Encounter (Signed)
Spoke with the pt and informed him of recent lab results and note.  Pt understood and agreed.  Pt stated that he has a follow-up appt scheduled for (08-17-13) with Cody.//AB/CMA

## 2013-08-17 ENCOUNTER — Ambulatory Visit: Payer: BC Managed Care – PPO | Admitting: Physician Assistant

## 2013-08-18 ENCOUNTER — Ambulatory Visit: Payer: BC Managed Care – PPO | Admitting: Physician Assistant

## 2013-08-30 ENCOUNTER — Encounter: Payer: Self-pay | Admitting: Physician Assistant

## 2013-08-30 ENCOUNTER — Ambulatory Visit (INDEPENDENT_AMBULATORY_CARE_PROVIDER_SITE_OTHER): Payer: BC Managed Care – PPO | Admitting: Physician Assistant

## 2013-08-30 VITALS — BP 110/94 | HR 53 | Temp 98.2°F | Resp 16 | Ht 72.0 in | Wt 209.0 lb

## 2013-08-30 DIAGNOSIS — I10 Essential (primary) hypertension: Secondary | ICD-10-CM

## 2013-08-30 DIAGNOSIS — R748 Abnormal levels of other serum enzymes: Secondary | ICD-10-CM

## 2013-08-30 DIAGNOSIS — F411 Generalized anxiety disorder: Secondary | ICD-10-CM

## 2013-08-30 LAB — COMPREHENSIVE METABOLIC PANEL
ALT: 36 U/L (ref 0–53)
AST: 33 U/L (ref 0–37)
Albumin: 4.9 g/dL (ref 3.5–5.2)
Alkaline Phosphatase: 67 U/L (ref 39–117)
BUN: 13 mg/dL (ref 6–23)
CALCIUM: 10 mg/dL (ref 8.4–10.5)
CO2: 29 meq/L (ref 19–32)
CREATININE: 0.97 mg/dL (ref 0.50–1.35)
Chloride: 95 mEq/L — ABNORMAL LOW (ref 96–112)
Glucose, Bld: 128 mg/dL — ABNORMAL HIGH (ref 70–99)
Potassium: 4.6 mEq/L (ref 3.5–5.3)
Sodium: 135 mEq/L (ref 135–145)
Total Bilirubin: 0.7 mg/dL (ref 0.2–1.2)
Total Protein: 7.3 g/dL (ref 6.0–8.3)

## 2013-08-30 MED ORDER — SERTRALINE HCL 50 MG PO TABS: ORAL_TABLET | ORAL | Status: DC

## 2013-08-30 NOTE — Progress Notes (Signed)
Patient presents to clinic today for follow-up of Hypertension, anxiety and elevated liver enzymes.  Hypertension -- Endorses taking medications as directed.  Denies headache, vision changes, chest pain, palpitations, SOB or LH. BP 110/94 today.   Anxiety -- Patient unable to tolerate Lexapro due to side effect.  Endorses continued and unchanged symptoms.  Denies depressed mood or anhedonia.  Denies SI/HI.  Elevated liver enzymes -- mild elevation of AST and ALT with ALT>AST. Patient with extensive drinking history. Has stopped alcohol consumption.  Denies use of tylenol-containing products. Denies abdominal pain, nausea/vomiting, or dark colored urine.    Past Medical History  Diagnosis Date  . Hypertension   . High cholesterol     Current Outpatient Prescriptions on File Prior to Visit  Medication Sig Dispense Refill  . lisinopril-hydrochlorothiazide (PRINZIDE,ZESTORETIC) 20-12.5 MG per tablet Take 1 tablet by mouth 2 (two) times daily.  180 tablet  1  . Multiple Vitamins-Minerals (CENTRUM SILVER ADULT 50+) TABS Take 1 tablet by mouth daily.      . [DISCONTINUED] lisinopril (PRINIVIL,ZESTRIL) 20 MG tablet Take 1 tablet (20 mg total) by mouth daily.  30 tablet  0   No current facility-administered medications on file prior to visit.    No Known Allergies  Family History  Problem Relation Age of Onset  . Hypertension Mother     Living  . Thyroid disease Mother   . Diabetes Father 56    Deceased  . Obesity Father   . Heart attack Father   . Diabetes Other     Paternal Side of Family  . Alzheimer's disease Maternal Grandmother   . Diabetes Sister   . Healthy Son     x3    History   Social History  . Marital Status: Married    Spouse Name: N/A    Number of Children: N/A  . Years of Education: N/A   Social History Main Topics  . Smoking status: Never Smoker   . Smokeless tobacco: Never Used  . Alcohol Use: 6.0 oz/week    10 Shots of liquor per week  . Drug Use: No   . Sexual Activity: None   Other Topics Concern  . None   Social History Narrative  . None   Review of Systems - See HPI.  All other ROS are negative.  BP 110/94  Pulse 53  Temp(Src) 98.2 F (36.8 C) (Oral)  Resp 16  Ht 6' (1.829 m)  Wt 209 lb (94.802 kg)  BMI 28.34 kg/m2  SpO2 98%  Physical Exam  Vitals reviewed. Constitutional: He is oriented to person, place, and time and well-developed, well-nourished, and in no distress.  HENT:  Head: Normocephalic and atraumatic.  Eyes: Conjunctivae are normal. Pupils are equal, round, and reactive to light.  Neck: Neck supple. No thyromegaly present.  Cardiovascular: Normal rate, regular rhythm, normal heart sounds and intact distal pulses.   Pulmonary/Chest: Effort normal and breath sounds normal. No respiratory distress. He has no wheezes. He has no rales. He exhibits no tenderness.  Abdominal: Soft. Bowel sounds are normal. He exhibits no distension and no mass. There is no tenderness.  Lymphadenopathy:    He has no cervical adenopathy.  Neurological: He is alert and oriented to person, place, and time.  Skin: Skin is warm and dry. No rash noted.  Psychiatric: Affect normal.    Recent Results (from the past 2160 hour(s))  CBC WITH DIFFERENTIAL     Status: Abnormal   Collection Time    07/19/13  8:35 AM      Result Value Ref Range   WBC 7.5  4.0 - 10.5 K/uL   RBC 5.19  4.22 - 5.81 MIL/uL   Hemoglobin 16.1  13.0 - 17.0 g/dL   HCT 44.7  39.0 - 52.0 %   MCV 86.1  78.0 - 100.0 fL   MCH 31.0  26.0 - 34.0 pg   MCHC 36.0  30.0 - 36.0 g/dL   RDW 13.7  11.5 - 15.5 %   Platelets 344  150 - 400 K/uL   Neutrophils Relative % 56  43 - 77 %   Neutro Abs 4.2  1.7 - 7.7 K/uL   Lymphocytes Relative 25  12 - 46 %   Lymphs Abs 1.9  0.7 - 4.0 K/uL   Monocytes Relative 13 (*) 3 - 12 %   Monocytes Absolute 1.0  0.1 - 1.0 K/uL   Eosinophils Relative 5  0 - 5 %   Eosinophils Absolute 0.4  0.0 - 0.7 K/uL   Basophils Relative 1  0 - 1 %    Basophils Absolute 0.1  0.0 - 0.1 K/uL   Smear Review Criteria for review not met    BASIC METABOLIC PANEL     Status: Abnormal   Collection Time    07/19/13  8:35 AM      Result Value Ref Range   Sodium 132 (*) 135 - 145 mEq/L   Potassium 4.6  3.5 - 5.3 mEq/L   Chloride 95 (*) 96 - 112 mEq/L   CO2 30  19 - 32 mEq/L   Glucose, Bld 113 (*) 70 - 99 mg/dL   BUN 12  6 - 23 mg/dL   Creat 0.88  0.50 - 1.35 mg/dL   Calcium 9.8  8.4 - 10.5 mg/dL  HEPATIC FUNCTION PANEL     Status: Abnormal   Collection Time    07/19/13  8:35 AM      Result Value Ref Range   Total Bilirubin 0.6  0.2 - 1.2 mg/dL   Bilirubin, Direct 0.1  0.0 - 0.3 mg/dL   Indirect Bilirubin 0.5  0.2 - 1.2 mg/dL   Alkaline Phosphatase 91  39 - 117 U/L   AST 87 (*) 0 - 37 U/L   ALT 112 (*) 0 - 53 U/L   Total Protein 7.5  6.0 - 8.3 g/dL   Albumin 4.9  3.5 - 5.2 g/dL  TSH     Status: None   Collection Time    07/19/13  8:35 AM      Result Value Ref Range   TSH 1.745  0.350 - 4.500 uIU/mL  HEMOGLOBIN A1C     Status: Abnormal   Collection Time    07/19/13  8:35 AM      Result Value Ref Range   Hemoglobin A1C 6.2 (*) <5.7 %   Comment:                                                                            According to the ADA Clinical Practice Recommendations for 2011, when     HbA1c is used as a screening test:             >=  6.5%   Diagnostic of Diabetes Mellitus                (if abnormal result is confirmed)           5.7-6.4%   Increased risk of developing Diabetes Mellitus           References:Diagnosis and Classification of Diabetes Mellitus,Diabetes     MOQH,4765,46(TKPTW 1):S62-S69 and Standards of Medical Care in             Diabetes - 2011,Diabetes Care,2011,34 (Suppl 1):S11-S61.         Mean Plasma Glucose 131 (*) <117 mg/dL  PSA     Status: None   Collection Time    07/19/13  8:35 AM      Result Value Ref Range   PSA 0.56  <=4.00 ng/mL   Comment: Test Methodology: ECLIA PSA  (Electrochemiluminescence Immunoassay)           For PSA values from 2.5-4.0, particularly in younger men <60 years     old, the AUA and NCCN suggest testing for % Free PSA (3515) and     evaluation of the rate of increase in PSA (PSA velocity).  URINALYSIS, ROUTINE W REFLEX MICROSCOPIC     Status: None   Collection Time    07/19/13  8:35 AM      Result Value Ref Range   Color, Urine YELLOW  YELLOW   APPearance CLEAR  CLEAR   Specific Gravity, Urine 1.016  1.005 - 1.030   pH 6.0  5.0 - 8.0   Glucose, UA NEG  NEG mg/dL   Bilirubin Urine NEG  NEG   Ketones, ur NEG  NEG mg/dL   Hgb urine dipstick NEG  NEG   Protein, ur NEG  NEG mg/dL   Urobilinogen, UA 0.2  0.0 - 1.0 mg/dL   Nitrite NEG  NEG   Leukocytes, UA NEG  NEG  LIPID PANEL     Status: Abnormal   Collection Time    07/19/13  8:35 AM      Result Value Ref Range   Cholesterol 325 (*) 0 - 200 mg/dL   Comment: ATP III Classification:           < 200        mg/dL        Desirable          200 - 239     mg/dL        Borderline High          >= 240        mg/dL        High         Triglycerides 339 (*) <150 mg/dL   HDL 49  >39 mg/dL   Total CHOL/HDL Ratio 6.6     VLDL 68 (*) 0 - 40 mg/dL   LDL Cholesterol 208 (*) 0 - 99 mg/dL   Comment:       Total Cholesterol/HDL Ratio:CHD Risk                            Coronary Heart Disease Risk Table                                            Men       Women  1/2 Average Risk              3.4        3.3                  Average Risk              5.0        4.4               2X Average Risk              9.6        7.1               3X Average Risk             23.4       11.0     Use the calculated Patient Ratio above and the CHD Risk table      to determine the patient's CHD Risk.     ATP III Classification (LDL):           < 100        mg/dL         Optimal          100 - 129     mg/dL         Near or Above Optimal          130 - 159     mg/dL         Borderline High           160 - 189     mg/dL         High           > 190        mg/dL         Very High          Assessment/Plan: Hypertension Continue current regimen.   Generalized anxiety disorder Will attempt trial of sertraline for symptom relief.  25 mg daily x 1 week.  Then increase to 50 mg daily.  Discussed side effects with patient. Patient educated on risk of suicidal thought and is aware to stop medication and call office if this were to present itself.  Otherwise, follow-up in 1 month.   Elevated liver enzymes Repeat CMP.  Will hopefully see decline in enzymes now that patient has stopped alcohol consumption.  If still elevated, will need hepatitis panel and US abdomen.

## 2013-08-30 NOTE — Assessment & Plan Note (Signed)
Will attempt trial of sertraline for symptom relief.  25 mg daily x 1 week.  Then increase to 50 mg daily.  Discussed side effects with patient. Patient educated on risk of suicidal thought and is aware to stop medication and call office if this were to present itself.  Otherwise, follow-up in 1 month.

## 2013-08-30 NOTE — Patient Instructions (Signed)
Please obtain labs.  I will call you with your results.  If liver enzymes remain elevated, we will need further workup including an ultrasound of your abdomen.   For anxiety/modd -- Take 1/2 tablet daily of the sertraline for 1 week.  Then increase to 1 tablet daily.  Follow-up in 1 month.  If you develop agitation, excessive sleepiness or suicidal thought, please stop medication and call the office.

## 2013-08-30 NOTE — Assessment & Plan Note (Signed)
Repeat CMP.  Will hopefully see decline in enzymes now that patient has stopped alcohol consumption.  If still elevated, will need hepatitis panel and US abdomen.

## 2013-08-30 NOTE — Progress Notes (Signed)
Pre visit review using our clinic review tool, if applicable. No additional management support is needed unless otherwise documented below in the visit note/SLS  

## 2013-08-30 NOTE — Assessment & Plan Note (Signed)
Continue current regimen

## 2014-01-24 ENCOUNTER — Telehealth: Payer: Self-pay | Admitting: Physician Assistant

## 2014-01-24 ENCOUNTER — Other Ambulatory Visit: Payer: Self-pay | Admitting: Physician Assistant

## 2014-01-24 DIAGNOSIS — I10 Essential (primary) hypertension: Secondary | ICD-10-CM

## 2014-01-24 MED ORDER — LISINOPRIL-HYDROCHLOROTHIAZIDE 20-12.5 MG PO TABS
1.0000 | ORAL_TABLET | Freq: Two times a day (BID) | ORAL | Status: DC
Start: 2014-01-24 — End: 2014-02-07

## 2014-01-24 NOTE — Telephone Encounter (Signed)
Rx request to pharmacy/SLS Requested drug refills are authorized, however, the patient needs further evaluation and/or laboratory testing before further refills are given. Ask him to make an appointment for this.  Please call pt to schedule F/U appointment that was due in June 2015 for future refills/SLS Thanks!

## 2014-01-24 NOTE — Telephone Encounter (Signed)
The 30-day has already been filled by nursing.  If you see below, nurse has already sent in Rx.

## 2014-01-24 NOTE — Telephone Encounter (Signed)
Called pt to schedule.  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  Advise.

## 2014-01-24 NOTE — Telephone Encounter (Signed)
Caller name: Marquon Relation to pt: Call back number: [phone] Pharmacy: [facility]  Reason for call:  Pt would like a 30 day supply of Rx lisinopril-hydrochlorothiazide (PRINZIDE,ZESTORETIC) 20-12.5 MG per tablet

## 2014-01-24 NOTE — Telephone Encounter (Signed)
Thank you :)

## 2014-01-24 NOTE — Telephone Encounter (Addendum)
I am aware.  Was just passing along information that was relayed to me.  Pt is still scheduled for his appointment; pt just wanted [place] to know that if we weren't going to fill his scripts, he was going elsewhere.

## 2014-01-29 ENCOUNTER — Ambulatory Visit: Payer: BC Managed Care – PPO | Admitting: Physician Assistant

## 2014-02-07 ENCOUNTER — Ambulatory Visit (INDEPENDENT_AMBULATORY_CARE_PROVIDER_SITE_OTHER): Payer: BC Managed Care – PPO | Admitting: Physician Assistant

## 2014-02-07 ENCOUNTER — Encounter: Payer: Self-pay | Admitting: Internal Medicine

## 2014-02-07 ENCOUNTER — Encounter: Payer: Self-pay | Admitting: Physician Assistant

## 2014-02-07 VITALS — BP 128/85 | HR 72 | Temp 98.2°F | Wt 207.2 lb

## 2014-02-07 DIAGNOSIS — I10 Essential (primary) hypertension: Secondary | ICD-10-CM

## 2014-02-07 DIAGNOSIS — Z1211 Encounter for screening for malignant neoplasm of colon: Secondary | ICD-10-CM

## 2014-02-07 DIAGNOSIS — F419 Anxiety disorder, unspecified: Secondary | ICD-10-CM

## 2014-02-07 DIAGNOSIS — F32A Depression, unspecified: Secondary | ICD-10-CM

## 2014-02-07 DIAGNOSIS — F329 Major depressive disorder, single episode, unspecified: Secondary | ICD-10-CM | POA: Insufficient documentation

## 2014-02-07 HISTORY — DX: Depression, unspecified: F32.A

## 2014-02-07 HISTORY — DX: Encounter for screening for malignant neoplasm of colon: Z12.11

## 2014-02-07 MED ORDER — LISINOPRIL-HYDROCHLOROTHIAZIDE 20-12.5 MG PO TABS: 1.0000 | ORAL_TABLET | Freq: Two times a day (BID) | ORAL | Status: DC

## 2014-02-07 NOTE — Assessment & Plan Note (Signed)
Negative family history.  Asymptomatic. Colonoscopy discussed with patient.  Wishes to proceed with procedure.  Referral placed for screening colonoscopy.  Handout given.

## 2014-02-07 NOTE — Assessment & Plan Note (Signed)
Could not tolerate Sertraline.  Does not wish to attempt additional medication at present.  Is agreeing to see a counselor.  Referral placed for counseling services.  Follow-up in 6 months.  Return sooner if needed.

## 2014-02-07 NOTE — Patient Instructions (Signed)
Please continue medications as directed.  You will be contacted by Gastroenterology for a colonoscopy, and by Terri Bauert's office for counseling services.  Follow-up in 6 months.  Colonoscopy A colonoscopy is an exam to look at the entire large intestine (colon). This exam can help find problems such as tumors, polyps, inflammation, and areas of bleeding. The exam takes about 1 hour.  LET [facility] CARE PROVIDER KNOW ABOUT:   Any allergies you have.  All medicines you are taking, including vitamins, herbs, eye drops, creams, and over-the-counter medicines.  Previous problems you or members of your family have had with the use of anesthetics.  Any blood disorders you have.  Previous surgeries you have had.  Medical conditions you have. RISKS AND COMPLICATIONS  Generally, this is a safe procedure. However, as with any procedure, complications can occur. Possible complications include:  Bleeding.  Tearing or rupture of the colon wall.  Reaction to medicines given during the exam.  Infection (rare). BEFORE THE PROCEDURE   Ask your health care provider about changing or stopping your regular medicines.  You may be prescribed an oral bowel prep. This involves drinking a large amount of medicated liquid, starting the day before your procedure. The liquid will cause you to have multiple loose stools until your stool is almost clear or light green. This cleans out your colon in preparation for the procedure.  Do not eat or drink anything else once you have started the bowel prep, unless your health care provider tells you it is safe to do so.  Arrange for someone to drive you home after the procedure. PROCEDURE   You will be given medicine to help you relax (sedative).  You will lie on your side with your knees bent.  A long, flexible tube with a light and camera on the end (colonoscope) will be inserted through the rectum and into the colon. The camera sends video back to a  computer screen as it moves through the colon. The colonoscope also releases carbon dioxide gas to inflate the colon. This helps your health care provider see the area better.  During the exam, your health care provider may take a small tissue sample (biopsy) to be examined under a microscope if any abnormalities are found.  The exam is finished when the entire colon has been viewed. AFTER THE PROCEDURE   Do not drive for 24 hours after the exam.  You may have a small amount of blood in your stool.  You may pass moderate amounts of gas and have mild abdominal cramping or bloating. This is caused by the gas used to inflate your colon during the exam.  Ask when your test results will be ready and how you will get your results. Make sure you get your test results. Document Released: 03/20/2000 Document Revised: 01/11/2013 Document Reviewed: 11/28/2012 [facility] Patient Information 2015 [place], [place]. This information is not intended to replace advice given to you by your health care provider. Make sure you discuss any questions you have with your health care provider.

## 2014-02-07 NOTE — Progress Notes (Signed)
   Patient presents to clinic today for follow-up of hypertension as well as anxiety/depression.  Hypertension -- Previously well-controlled with current regimen.  Denies chest pain, palpitations, lightheadedness, vision changes.  BP Readings from Last 3 Encounters:  02/07/14 128/85  08/30/13 110/94  07/19/13 128/94   Anxiety/Depression -- Has stopped taking the Sertraline due to side effect.  Overall doing well. Denies panic attacks, depressed mood or SI/HI. Wishes to handle this on his own.  Is willing to see a counselor.  Will place a referral to [employer].   Past Medical History  Diagnosis Date  . Hypertension   . High cholesterol     Current Outpatient Prescriptions on File Prior to Visit  Medication Sig Dispense Refill  . Multiple Vitamins-Minerals (CENTRUM SILVER ADULT 50+) TABS Take 1 tablet by mouth daily.    . [DISCONTINUED] lisinopril (PRINIVIL,ZESTRIL) 20 MG tablet Take 1 tablet (20 mg total) by mouth daily. 30 tablet 0   No current facility-administered medications on file prior to visit.    No Known Allergies  Family History  Problem Relation Age of Onset  . Hypertension Mother     Living  . Thyroid disease Mother   . Diabetes Father 49    Deceased  . Obesity Father   . Heart attack Father   . Diabetes Other     Paternal Side of Family  . Alzheimer's disease Maternal Grandmother   . Diabetes Sister   . Healthy Son     x3    History   Social History  . Marital Status: Married    Spouse Name: N/A    Number of Children: N/A  . Years of Education: N/A   Social History Main Topics  . Smoking status: Never Smoker   . Smokeless tobacco: Never Used  . Alcohol Use: 6.0 oz/week    10 Shots of liquor per week  . Drug Use: No  . Sexual Activity: None   Other Topics Concern  . None   Social History Narrative   Review of Systems - See HPI.  All other ROS are negative.  BP 128/85 mmHg  Pulse 72  Temp(Src) 98.2 F (36.8 C) (Oral)  Wt 207 lb 3.2  oz (93.985 kg)  SpO2 99%  Physical Exam  Constitutional: He is oriented to person, place, and time and well-developed, well-nourished, and in no distress.  HENT:  Head: Normocephalic and atraumatic.  Cardiovascular: Normal rate and regular rhythm.   Pulmonary/Chest: Effort normal and breath sounds normal. No respiratory distress. He has no wheezes. He has no rales. He exhibits no tenderness.  Neurological: He is alert and oriented to person, place, and time.  Skin: Skin is warm and dry. No rash noted.  Psychiatric: Affect normal.  Vitals reviewed.  Assessment/Plan: Hypertension Well-controlled. Asymptomatic.  Medications refilled. Follow-up 6 months.  Will obtain labs at that time.  Colon cancer screening Negative family history.  Asymptomatic. Colonoscopy discussed with patient.  Wishes to proceed with procedure.  Referral placed for screening colonoscopy.  Handout given.  Anxiety Could not tolerate Sertraline.  Does not wish to attempt additional medication at present.  Is agreeing to see a counselor.  Referral placed for counseling services.  Follow-up in 6 months.  Return sooner if needed.

## 2014-02-07 NOTE — Assessment & Plan Note (Signed)
Well-controlled. Asymptomatic.  Medications refilled. Follow-up 6 months.  Will obtain labs at that time.

## 2014-02-07 NOTE — Progress Notes (Signed)
Pre visit review using our clinic review tool, if applicable. No additional management support is needed unless otherwise documented below in the visit note. 

## 2014-04-11 ENCOUNTER — Telehealth: Payer: Self-pay | Admitting: Physician Assistant

## 2014-04-11 NOTE — Telephone Encounter (Signed)
Caller name: Lorie Relation to pt: self Call back number: [phone] Pharmacy:  Reason for call:   Patient wants to know who Einar Pheasant recommends for counseling?

## 2014-04-11 NOTE — Telephone Encounter (Signed)
Per provider VO, patient informed recommendation is Clint Bolder, SW, LCSW who works here out of [place] office and contact number [phone] to call to schedule appointment; pt understood & agreed/SLS

## 2014-04-16 ENCOUNTER — Ambulatory Visit (INDEPENDENT_AMBULATORY_CARE_PROVIDER_SITE_OTHER): Payer: 59 | Admitting: Psychology

## 2014-04-16 DIAGNOSIS — F329 Major depressive disorder, single episode, unspecified: Secondary | ICD-10-CM

## 2014-04-19 ENCOUNTER — Ambulatory Visit (AMBULATORY_SURGERY_CENTER): Payer: Self-pay | Admitting: *Deleted

## 2014-04-19 VITALS — Ht 72.0 in | Wt 202.4 lb

## 2014-04-19 DIAGNOSIS — Z1211 Encounter for screening for malignant neoplasm of colon: Secondary | ICD-10-CM

## 2014-04-19 MED ORDER — MOVIPREP 100 G PO SOLR: 1.0000 | Freq: Once | ORAL | Status: DC

## 2014-04-19 NOTE — Progress Notes (Signed)
No egg or soy allergy. ewm No diet pills. ewm No home 02 use. ewm Never had sedation, no past surgeries. ewm Pt declined emmi video. ewm

## 2014-04-30 ENCOUNTER — Ambulatory Visit (INDEPENDENT_AMBULATORY_CARE_PROVIDER_SITE_OTHER): Payer: 59 | Admitting: Psychology

## 2014-04-30 ENCOUNTER — Telehealth: Payer: Self-pay | Admitting: *Deleted

## 2014-04-30 DIAGNOSIS — F419 Anxiety disorder, unspecified: Principal | ICD-10-CM

## 2014-04-30 DIAGNOSIS — F32A Depression, unspecified: Secondary | ICD-10-CM

## 2014-04-30 DIAGNOSIS — F329 Major depressive disorder, single episode, unspecified: Secondary | ICD-10-CM

## 2014-04-30 DIAGNOSIS — F6381 Intermittent explosive disorder: Secondary | ICD-10-CM

## 2014-04-30 MED ORDER — BUPROPION HCL ER (XL) 150 MG PO TB24: 150.0000 mg | ORAL_TABLET | Freq: Every day | ORAL | Status: DC

## 2014-04-30 NOTE — Telephone Encounter (Signed)
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

## 2014-04-30 NOTE — Telephone Encounter (Addendum)
If he has not taken the Wellbutrin and is having continued depressive symptoms with explosive personality, I recommend we start Fluoxetine as it is proven to help with depressed mood, anxiety and irritability.  I recommend we start with a 20 mg tablet.  He is to take 1/2 tablet (10mg ) daily for 1 week, before increasing to 1 tablet (20 mg) daily.  I want to see him in 2 weeks after starting medication.  If symptoms worsen before then, I want to see him sooner.  Common side effects can include -- GI upset, drowsiness, or rarely worsening mood.  He is not to drink alcohol with this medication.  Recommend he take medication each morning.

## 2014-04-30 NOTE — Telephone Encounter (Signed)
Will defer med changes to pt's provider.  Please schedule first available follow up with [facility].

## 2014-05-01 DIAGNOSIS — F6381 Intermittent explosive disorder: Secondary | ICD-10-CM | POA: Insufficient documentation

## 2014-05-01 HISTORY — DX: Intermittent explosive disorder: F63.81

## 2014-05-01 MED ORDER — FLUOXETINE HCL 20 MG PO TABS: ORAL_TABLET | ORAL | Status: DC

## 2014-05-01 NOTE — Telephone Encounter (Signed)
Patient informed, he states that he still will have his "1-2 alcohol drinks in the evening", as he "cannot just stop like that"; reiterated if any side effects were to occur and/or worsening mood to d/c medication & call office, pt understood & agreed to new medication and has f/u appointment scheduled/SLS

## 2014-05-03 ENCOUNTER — Encounter: Payer: BC Managed Care – PPO | Admitting: Internal Medicine

## 2014-05-16 ENCOUNTER — Ambulatory Visit (INDEPENDENT_AMBULATORY_CARE_PROVIDER_SITE_OTHER): Payer: 59 | Admitting: Psychology

## 2014-05-16 DIAGNOSIS — F329 Major depressive disorder, single episode, unspecified: Secondary | ICD-10-CM

## 2014-05-22 ENCOUNTER — Ambulatory Visit: Payer: Self-pay | Admitting: Physician Assistant

## 2014-05-30 ENCOUNTER — Ambulatory Visit: Payer: Self-pay | Admitting: Physician Assistant

## 2014-05-30 ENCOUNTER — Telehealth: Payer: Self-pay | Admitting: Physician Assistant

## 2014-05-30 NOTE — Telephone Encounter (Signed)
Caller name: [name], [name] Relation to pt: self  Call back number: [phone] Pharmacy: [facility], [place] [phone] (Phone) [phone] (Fax)         Reason for call:  Pt had the flu and now that he is feeling better started taking FLUoxetine (PROZAC) 20 MG tablet. Pt states he feels wired is this normal. In need of clinical advice.

## 2014-05-31 NOTE — Telephone Encounter (Signed)
Provider's recommendation discussed with patient.  He stated understanding and agreed to comply.  He knows to call back if symptoms worsen or fail to improve.

## 2014-05-31 NOTE — Telephone Encounter (Signed)
Can be him adjusting to medication.  If it is the tablet, he can take 1/2 tablet daily for a few days before resuming a whole tablet daily.  This will help heim adjust to medication better.

## 2014-05-31 NOTE — Telephone Encounter (Signed)
Left a message for call back.  

## 2014-06-13 ENCOUNTER — Ambulatory Visit (INDEPENDENT_AMBULATORY_CARE_PROVIDER_SITE_OTHER): Payer: 59 | Admitting: Physician Assistant

## 2014-06-13 ENCOUNTER — Encounter: Payer: Self-pay | Admitting: Physician Assistant

## 2014-06-13 ENCOUNTER — Ambulatory Visit (INDEPENDENT_AMBULATORY_CARE_PROVIDER_SITE_OTHER): Payer: 59 | Admitting: Psychology

## 2014-06-13 VITALS — BP 128/92 | HR 77 | Temp 98.4°F | Resp 16 | Ht 72.0 in | Wt 203.4 lb

## 2014-06-13 DIAGNOSIS — F329 Major depressive disorder, single episode, unspecified: Secondary | ICD-10-CM

## 2014-06-13 DIAGNOSIS — F418 Other specified anxiety disorders: Secondary | ICD-10-CM

## 2014-06-13 DIAGNOSIS — F419 Anxiety disorder, unspecified: Secondary | ICD-10-CM

## 2014-06-13 DIAGNOSIS — I10 Essential (primary) hypertension: Secondary | ICD-10-CM

## 2014-06-13 DIAGNOSIS — F32A Depression, unspecified: Secondary | ICD-10-CM

## 2014-06-13 MED ORDER — BUPROPION HCL ER (XL) 150 MG PO TB24: 150.0000 mg | ORAL_TABLET | Freq: Every day | ORAL | Status: DC

## 2014-06-13 MED ORDER — LISINOPRIL-HYDROCHLOROTHIAZIDE 20-12.5 MG PO TABS: 1.0000 | ORAL_TABLET | Freq: Two times a day (BID) | ORAL | Status: DC

## 2014-06-13 NOTE — Progress Notes (Signed)
Patient presents to clinic today for follow-up of anxiety and depression. Patient had started Prozac daily for symptoms. Patient endorses after being on medication for a week, his symptoms acutely worsen. Endorses significantly depressed mood and increased anxiety and irritability. Has since weaned himself off of the medication. Patient would like to discuss other medication options. Has previously failed multiple SSRIs. Wellbutrin XL have been discussed previously, the patient wished to proceed with Prozac. Is now willing to give the Wellbutrin to try. Patient denies suicidal thought or ideation.  Past Medical History  Diagnosis Date  . Hypertension   . High cholesterol   . Allergy     seasonal  . Sinusitis     Current Outpatient Prescriptions on File Prior to Visit  Medication Sig Dispense Refill  . fluticasone (FLONASE) 50 MCG/ACT nasal spray Place 1 spray into both nostrils daily.  1  . Multiple Vitamins-Minerals (CENTRUM SILVER ADULT 50+) TABS Take 1 tablet by mouth daily.    . naproxen sodium (ANAPROX) 220 MG tablet Take 220 mg by mouth 2 (two) times daily with a meal.    . MOVIPREP 100 G SOLR Take 1 kit (200 g total) by mouth once. moviprep as directed. No substitutions (Patient not taking: Reported on 06/13/2014) 1 kit 0  . [DISCONTINUED] lisinopril (PRINIVIL,ZESTRIL) 20 MG tablet Take 1 tablet (20 mg total) by mouth daily. 30 tablet 0   No current facility-administered medications on file prior to visit.    No Known Allergies  Family History  Problem Relation Age of Onset  . Hypertension Mother     Living  . Thyroid disease Mother   . Diabetes Father 75    Deceased  . Obesity Father   . Heart attack Father   . Diabetes Other     Paternal Side of Family  . Alzheimer's disease Maternal Grandmother   . Diabetes Sister   . Healthy Son     x3  . Colon cancer Neg Hx   . Rectal cancer Neg Hx   . Stomach cancer Neg Hx     History   Social History  . Marital Status:  Married    Spouse Name: N/A  . Number of Children: N/A  . Years of Education: N/A   Social History Main Topics  . Smoking status: Never Smoker   . Smokeless tobacco: Never Used  . Alcohol Use: 6.0 oz/week    10 Shots of liquor per week  . Drug Use: No  . Sexual Activity: Not on file   Other Topics Concern  . None   Social History Narrative    Review of Systems - See HPI.  All other ROS are negative.  BP 128/92 mmHg  Pulse 77  Temp(Src) 98.4 F (36.9 C) (Oral)  Resp 16  Ht 6' (1.829 m)  Wt 203 lb 6 oz (92.25 kg)  BMI 27.58 kg/m2  SpO2 99%  Physical Exam  Constitutional: He is oriented to person, place, and time and well-developed, well-nourished, and in no distress.  HENT:  Head: Normocephalic and atraumatic.  Cardiovascular: Normal rate, regular rhythm, normal heart sounds and intact distal pulses.   Pulmonary/Chest: Effort normal and breath sounds normal. No respiratory distress. He has no wheezes. He has no rales. He exhibits no tenderness.  Neurological: He is alert and oriented to person, place, and time.  Skin: Skin is warm and dry. No rash noted.  Vitals reviewed.  Assessment/Plan: Anxiety and depression We'll stop Prozac as it worsened symptoms. At  baseline, depressed mood is the major concern with very minimal anxiety. He feels appropriate to give an attempt Wellbutrin XL, as patient has failed many other typical antidepressant medications. We'll begin Wellbutrin XL 150 mg daily.

## 2014-06-13 NOTE — Progress Notes (Signed)
Pre visit review using our clinic review tool, if applicable. No additional management support is needed unless otherwise documented below in the visit note/SLS  

## 2014-06-13 NOTE — Patient Instructions (Signed)
Please stop the Prozac and begin the Wellbutrin XL daily. Get some over-the-counter melatonin to help with sleep.    Follow-up in 1 month.  Return sooner if needed.

## 2014-06-13 NOTE — Assessment & Plan Note (Signed)
We'll stop Prozac as it worsened symptoms. At baseline, depressed mood is the major concern with very minimal anxiety. He feels appropriate to give an attempt Wellbutrin XL, as patient has failed many other typical antidepressant medications. We'll begin Wellbutrin XL 150 mg daily.

## 2014-07-11 ENCOUNTER — Ambulatory Visit: Payer: 59 | Admitting: Psychology

## 2014-07-24 ENCOUNTER — Encounter: Payer: Self-pay | Admitting: Physician Assistant

## 2014-07-24 ENCOUNTER — Ambulatory Visit (INDEPENDENT_AMBULATORY_CARE_PROVIDER_SITE_OTHER): Payer: 59 | Admitting: Physician Assistant

## 2014-07-24 VITALS — BP 119/69 | HR 84 | Temp 98.4°F | Resp 16 | Ht 72.0 in | Wt 202.0 lb

## 2014-07-24 DIAGNOSIS — J208 Acute bronchitis due to other specified organisms: Principal | ICD-10-CM

## 2014-07-24 DIAGNOSIS — J00 Acute nasopharyngitis [common cold]: Secondary | ICD-10-CM

## 2014-07-24 DIAGNOSIS — B9689 Other specified bacterial agents as the cause of diseases classified elsewhere: Secondary | ICD-10-CM | POA: Insufficient documentation

## 2014-07-24 MED ORDER — HYDROCOD POLST-CPM POLST ER 10-8 MG/5ML PO SUER: 5.0000 mL | Freq: Two times a day (BID) | ORAL | Status: DC | PRN

## 2014-07-24 MED ORDER — DOXYCYCLINE HYCLATE 100 MG PO CAPS: 100.0000 mg | ORAL_CAPSULE | Freq: Two times a day (BID) | ORAL | Status: DC

## 2014-07-24 NOTE — Assessment & Plan Note (Signed)
Zithromax sub-therapeutic. Rx. Doxycycline. Rx Tussionex.  Supportive measures discussed.  Continue allergy regimen. Patient instructed to call or return to clinic if symptoms are not improving.

## 2014-07-24 NOTE — Progress Notes (Signed)
Pre visit review using our clinic review tool, if applicable. No additional management support is needed unless otherwise documented below in the visit note/SLS  

## 2014-07-24 NOTE — Patient Instructions (Signed)
Please take antibiotic as directed.   Use Tussionex for cough.  Do not drive while on this medication. Use plain Mucinex twice daily. Stay well hydrated. Continue allergy medications.  Follow-up if symptoms are not improving.  Acute Bronchitis Bronchitis is when the airways that extend from the windpipe into the lungs get red, puffy, and painful (inflamed). Bronchitis often causes thick spit (mucus) to develop. This leads to a cough. A cough is the most common symptom of bronchitis. In acute bronchitis, the condition usually begins suddenly and goes away over time (usually in 2 weeks). Smoking, allergies, and asthma can make bronchitis worse. Repeated episodes of bronchitis may cause more lung problems. HOME CARE  Rest.  Drink enough fluids to keep your pee (urine) clear or pale yellow (unless you need to limit fluids as told by your doctor).  Only take over-the-counter or prescription medicines as told by your doctor.  Avoid smoking and secondhand smoke. These can make bronchitis worse. If you are a smoker, think about using nicotine gum or skin patches. Quitting smoking will help your lungs heal faster.  Reduce the chance of getting bronchitis again by:  Washing your hands often.  Avoiding people with cold symptoms.  Trying not to touch your hands to your mouth, nose, or eyes.  Follow up with your doctor as told. GET HELP IF: Your symptoms do not improve after 1 week of treatment. Symptoms include:  Cough.  Fever.  Coughing up thick spit.  Body aches.  Chest congestion.  Chills.  Shortness of breath.  Sore throat. GET HELP RIGHT AWAY IF:   You have an increased fever.  You have chills.  You have severe shortness of breath.  You have bloody thick spit (sputum).  You throw up (vomit) often.  You lose too much body fluid (dehydration).  You have a severe headache.  You faint. MAKE SURE YOU:   Understand these instructions.  Will watch your  condition.  Will get help right away if you are not doing well or get worse. Document Released: 09/09/2007 Document Revised: 11/23/2012 Document Reviewed: 09/13/2012 [facility] Patient Information 2015 [place], [place]. This information is not intended to replace advice given to you by your health care provider. Make sure you discuss any questions you have with your health care provider.

## 2014-07-24 NOTE — Progress Notes (Signed)
Patient presents to clinic today c/o continued cough now with chest tenderness.  Also feels that chest congestion has worsened.  Has severely impacted his ability to sleep. Endorses subjective fevers. [facility] this [facility] and diagnosed with Acute bronchitis. Was given cough medication and Rx for Zithromax.  Onset of symptoms was 1 week ago.  Patient still having non-productive cough, PND, chest congestion and fatigue.   Past Medical History  Diagnosis Date  . Hypertension   . High cholesterol   . Allergy     seasonal  . Sinusitis     Current Outpatient Prescriptions on File Prior to Visit  Medication Sig Dispense Refill  . fluticasone (FLONASE) 50 MCG/ACT nasal spray Place 1 spray into both nostrils daily.  1  . lisinopril-hydrochlorothiazide (PRINZIDE,ZESTORETIC) 20-12.5 MG per tablet Take 1 tablet by mouth 2 (two) times daily. 180 tablet 1  . Multiple Vitamins-Minerals (CENTRUM SILVER ADULT 50+) TABS Take 1 tablet by mouth daily.    . naproxen sodium (ANAPROX) 220 MG tablet Take 220 mg by mouth 2 (two) times daily as needed.     Marland Kitchen MOVIPREP 100 G SOLR Take 1 kit (200 g total) by mouth once. moviprep as directed. No substitutions (Patient not taking: Reported on 06/13/2014) 1 kit 0  . [DISCONTINUED] lisinopril (PRINIVIL,ZESTRIL) 20 MG tablet Take 1 tablet (20 mg total) by mouth daily. 30 tablet 0   No current facility-administered medications on file prior to visit.    No Known Allergies  Family History  Problem Relation Age of Onset  . Hypertension Mother     Living  . Thyroid disease Mother   . Diabetes Father 73    Deceased  . Obesity Father   . Heart attack Father   . Diabetes Other     Paternal Side of Family  . Alzheimer's disease Maternal Grandmother   . Diabetes Sister   . Healthy Son     x3  . Colon cancer Neg Hx   . Rectal cancer Neg Hx   . Stomach cancer Neg Hx     History   Social History  . Marital Status: Married    Spouse Name: N/A  . Number  of Children: N/A  . Years of Education: N/A   Social History Main Topics  . Smoking status: Never Smoker   . Smokeless tobacco: Never Used  . Alcohol Use: 6.0 oz/week    10 Shots of liquor per week  . Drug Use: No  . Sexual Activity: Not on file   Other Topics Concern  . None   Social History Narrative   Review of Systems - See HPI.  All other ROS are negative.  BP 119/69 mmHg  Pulse 84  Temp(Src) 98.4 F (36.9 C) (Oral)  Resp 16  Ht 6' (1.829 m)  Wt 202 lb (91.627 kg)  BMI 27.39 kg/m2  SpO2 100%  Physical Exam  Constitutional: He is oriented to person, place, and time and well-developed, well-nourished, and in no distress.  HENT:  Head: Normocephalic and atraumatic.  Right Ear: External ear and ear canal normal. Tympanic membrane is retracted.  Left Ear: Tympanic membrane, external ear and ear canal normal.  Nose: Nose normal. Right sinus exhibits no maxillary sinus tenderness. Left sinus exhibits no maxillary sinus tenderness.  Mouth/Throat: Uvula is midline, oropharynx is clear and moist and mucous membranes are normal.  Eyes: Conjunctivae are normal.  Neck: Neck supple.  Cardiovascular: Normal rate, regular rhythm, normal heart sounds and intact distal  pulses.   Pulmonary/Chest: Effort normal and breath sounds normal. No respiratory distress. He has no wheezes. He has no rales. He exhibits no tenderness.  Neurological: He is alert and oriented to person, place, and time.  Skin: Skin is warm and dry. No rash noted.  Psychiatric: Affect normal.  Vitals reviewed.  Assessment/Plan: Acute bacterial bronchitis Zithromax sub-therapeutic. Rx. Doxycycline. Rx Tussionex.  Supportive measures discussed.  Continue allergy regimen. Patient instructed to call or return to clinic if symptoms are not improving.

## 2014-08-07 ENCOUNTER — Encounter: Payer: Self-pay | Admitting: Physician Assistant

## 2014-08-07 ENCOUNTER — Ambulatory Visit (INDEPENDENT_AMBULATORY_CARE_PROVIDER_SITE_OTHER): Payer: 59 | Admitting: Physician Assistant

## 2014-08-07 VITALS — BP 126/88 | HR 74 | Temp 98.2°F | Wt 205.0 lb

## 2014-08-07 DIAGNOSIS — J208 Acute bronchitis due to other specified organisms: Principal | ICD-10-CM

## 2014-08-07 DIAGNOSIS — J00 Acute nasopharyngitis [common cold]: Secondary | ICD-10-CM

## 2014-08-07 DIAGNOSIS — B9689 Other specified bacterial agents as the cause of diseases classified elsewhere: Secondary | ICD-10-CM

## 2014-08-07 NOTE — Assessment & Plan Note (Signed)
Resolving. Continue supportive measures and cough syrup.  Place a humidifier in the bedroom.

## 2014-08-07 NOTE — Patient Instructions (Signed)
Please continue Mucinex, increased fluids, rest and cough syrup. Place a humidifier in the bedroom.  Symptoms should continue to resolve over the next week. Call or return if symptoms worsen or new symptoms develop.

## 2014-08-07 NOTE — Progress Notes (Signed)
Patient presents to clinic today for follow-up of acute bacterial bronchitis.  Patient endorses symptoms are markedly improved.  Still with some mild chest congestion and cough mostly at night. Denies fever, chills or chest pain. Finished entire course of Doxycycline. Denies productive cough now.  Denies new symptom.  Past Medical History  Diagnosis Date  . Hypertension   . High cholesterol   . Allergy     seasonal  . Sinusitis     Current Outpatient Prescriptions on File Prior to Visit  Medication Sig Dispense Refill  . chlorpheniramine-HYDROcodone (TUSSIONEX PENNKINETIC ER) 10-8 MG/5ML SUER Take 5 mLs by mouth every 12 (twelve) hours as needed for cough. 140 mL 0  . fluticasone (FLONASE) 50 MCG/ACT nasal spray Place 1 spray into both nostrils daily.  1  . lisinopril-hydrochlorothiazide (PRINZIDE,ZESTORETIC) 20-12.5 MG per tablet Take 1 tablet by mouth 2 (two) times daily. 180 tablet 1  . MOVIPREP 100 G SOLR Take 1 kit (200 g total) by mouth once. moviprep as directed. No substitutions 1 kit 0  . Multiple Vitamins-Minerals (CENTRUM SILVER ADULT 50+) TABS Take 1 tablet by mouth daily.    . naproxen sodium (ANAPROX) 220 MG tablet Take 220 mg by mouth 2 (two) times daily as needed.     . doxycycline (VIBRAMYCIN) 100 MG capsule Take 1 capsule (100 mg total) by mouth 2 (two) times daily. (Patient not taking: Reported on 08/07/2014) 14 capsule 0  . [DISCONTINUED] lisinopril (PRINIVIL,ZESTRIL) 20 MG tablet Take 1 tablet (20 mg total) by mouth daily. 30 tablet 0   No current facility-administered medications on file prior to visit.    No Known Allergies  Family History  Problem Relation Age of Onset  . Hypertension Mother     Living  . Thyroid disease Mother   . Diabetes Father 11    Deceased  . Obesity Father   . Heart attack Father   . Diabetes Other     Paternal Side of Family  . Alzheimer's disease Maternal Grandmother   . Diabetes Sister   . Healthy Son     x3  . Colon  cancer Neg Hx   . Rectal cancer Neg Hx   . Stomach cancer Neg Hx     History   Social History  . Marital Status: Married    Spouse Name: N/A  . Number of Children: N/A  . Years of Education: N/A   Social History Main Topics  . Smoking status: Never Smoker   . Smokeless tobacco: Never Used  . Alcohol Use: 6.0 oz/week    10 Shots of liquor per week  . Drug Use: No  . Sexual Activity: Not on file   Other Topics Concern  . None   Social History Narrative   Review of Systems - See HPI.  All other ROS are negative.  BP 126/88 mmHg  Pulse 74  Temp(Src) 98.2 F (36.8 C)  Wt 205 lb (92.987 kg)  SpO2 99%  Physical Exam  Constitutional: He is oriented to person, place, and time and well-developed, well-nourished, and in no distress.  HENT:  Head: Normocephalic and atraumatic.  Right Ear: External ear normal.  Left Ear: External ear normal.  Nose: Nose normal.  Mouth/Throat: Oropharynx is clear and moist. No oropharyngeal exudate.  TM within normal limits bilaterally.  Eyes: Conjunctivae are normal.  Neck: Neck supple.  Cardiovascular: Normal rate, regular rhythm, normal heart sounds and intact distal pulses.   Pulmonary/Chest: Effort normal and breath sounds normal. No respiratory  distress. He has no wheezes. He has no rales. He exhibits no tenderness.  Lymphadenopathy:    He has no cervical adenopathy.  Neurological: He is alert and oriented to person, place, and time.  Skin: Skin is warm and dry. No rash noted.  Psychiatric: Affect normal.  Vitals reviewed.   No results found for this or any previous visit (from the past 2160 hour(s)).  Assessment/Plan: Acute bacterial bronchitis Resolving. Continue supportive measures and cough syrup.  Place a humidifier in the bedroom.

## 2014-08-07 NOTE — Progress Notes (Signed)
Pre visit review using our clinic review tool, if applicable. No additional management support is needed unless otherwise documented below in the visit note. 

## 2014-08-30 ENCOUNTER — Telehealth: Payer: Self-pay | Admitting: Physician Assistant

## 2014-08-30 NOTE — Telephone Encounter (Signed)
Caller name: Joshia  Relation to pt: self Call back number: [phone] Pharmacy:  Reason for call:   Patient states that he has gone back on prozac and is wanting to know what he can take for headaches, etc. He states that he read that while taking prozac he should not take ibuprofen because it causes stomach bleeding.

## 2014-08-30 NOTE — Telephone Encounter (Signed)
He can take Tylenol products to help for headaches.

## 2014-08-31 NOTE — Telephone Encounter (Signed)
Left message for patient to return my call.

## 2014-08-31 NOTE — Telephone Encounter (Signed)
Patient called back wanting to know what time of day should he be taking prozac?

## 2014-08-31 NOTE — Telephone Encounter (Signed)
Informed patient of this.  °

## 2014-09-04 NOTE — Telephone Encounter (Signed)
Called and [facility] @ 9:18am @ [phone]) informing the pt that he can take his Prozac anytime of the day as long as he takes it the same time everyday.  Informed the pt to give [place] a call back if he has any question.//AB/CMA

## 2014-11-27 ENCOUNTER — Other Ambulatory Visit: Payer: Self-pay | Admitting: Physician Assistant

## 2014-12-19 ENCOUNTER — Telehealth: Payer: Self-pay | Admitting: Physician Assistant

## 2014-12-19 NOTE — Telephone Encounter (Signed)
DOS 01/29/14 - pt disputing bill stating he was unaware of the appt and does not feel he should be required to pay $50 fee.

## 2014-12-26 ENCOUNTER — Encounter: Payer: Self-pay | Admitting: *Deleted

## 2014-12-26 ENCOUNTER — Telehealth: Payer: Self-pay | Admitting: *Deleted

## 2014-12-26 NOTE — Telephone Encounter (Signed)
Pre-Visit Call completed with patient and chart updated.   Pre-Visit Info documented in Specialty Comments under [employer].    

## 2014-12-28 ENCOUNTER — Ambulatory Visit (INDEPENDENT_AMBULATORY_CARE_PROVIDER_SITE_OTHER): Payer: 59 | Admitting: Physician Assistant

## 2014-12-28 ENCOUNTER — Encounter: Payer: Self-pay | Admitting: Physician Assistant

## 2014-12-28 VITALS — BP 102/78 | HR 67 | Temp 97.9°F | Resp 16 | Ht 72.0 in | Wt 203.2 lb

## 2014-12-28 DIAGNOSIS — F32A Depression, unspecified: Secondary | ICD-10-CM

## 2014-12-28 DIAGNOSIS — I10 Essential (primary) hypertension: Secondary | ICD-10-CM | POA: Diagnosis not present

## 2014-12-28 DIAGNOSIS — F418 Other specified anxiety disorders: Secondary | ICD-10-CM

## 2014-12-28 DIAGNOSIS — Z1211 Encounter for screening for malignant neoplasm of colon: Secondary | ICD-10-CM

## 2014-12-28 DIAGNOSIS — F419 Anxiety disorder, unspecified: Secondary | ICD-10-CM

## 2014-12-28 DIAGNOSIS — R7989 Other specified abnormal findings of blood chemistry: Secondary | ICD-10-CM

## 2014-12-28 DIAGNOSIS — F329 Major depressive disorder, single episode, unspecified: Secondary | ICD-10-CM

## 2014-12-28 DIAGNOSIS — Z23 Encounter for immunization: Secondary | ICD-10-CM

## 2014-12-28 DIAGNOSIS — Z0000 Encounter for general adult medical examination without abnormal findings: Secondary | ICD-10-CM

## 2014-12-28 LAB — COMPREHENSIVE METABOLIC PANEL
ALBUMIN: 4.3 g/dL (ref 3.5–5.2)
ALK PHOS: 74 U/L (ref 39–117)
ALT: 50 U/L (ref 0–53)
AST: 44 U/L — ABNORMAL HIGH (ref 0–37)
BUN: 12 mg/dL (ref 6–23)
CO2: 31 mEq/L (ref 19–32)
Calcium: 9.4 mg/dL (ref 8.4–10.5)
Chloride: 99 mEq/L (ref 96–112)
Creatinine, Ser: 0.84 mg/dL (ref 0.40–1.50)
GFR: 102.29 mL/min (ref >60.00)
GLUCOSE: 116 mg/dL — AB (ref 70–99)
Potassium: 3.8 mEq/L (ref 3.5–5.1)
Sodium: 137 mEq/L (ref 135–145)
Total Bilirubin: 0.4 mg/dL (ref 0.2–1.2)
Total Protein: 7.1 g/dL (ref 6.0–8.3)

## 2014-12-28 LAB — CBC
HCT: 43.1 % (ref 39.0–52.0)
HEMOGLOBIN: 14.7 g/dL (ref 13.0–17.0)
MCHC: 34 g/dL (ref 30.0–36.0)
MCV: 92.8 fl (ref 78.0–100.0)
Platelets: 374 10*3/uL (ref 150.0–400.0)
RBC: 4.64 Mil/uL (ref 4.22–5.81)
RDW: 12.5 % (ref 11.5–15.5)
WBC: 6.3 10*3/uL (ref 4.0–10.5)

## 2014-12-28 LAB — LDL CHOLESTEROL, DIRECT: Direct LDL: 190 mg/dL

## 2014-12-28 LAB — HEMOGLOBIN A1C: HEMOGLOBIN A1C: 5.9 % (ref 4.6–6.5)

## 2014-12-28 LAB — LIPID PANEL
Cholesterol: 322 mg/dL — ABNORMAL HIGH (ref 0–200)
HDL: 35.5 mg/dL — ABNORMAL LOW (ref >39.00)
Total CHOL/HDL Ratio: 9
Triglycerides: 557 mg/dL — ABNORMAL HIGH (ref 0.0–149.0)

## 2014-12-28 LAB — URINALYSIS, ROUTINE W REFLEX MICROSCOPIC
Bilirubin Urine: NEGATIVE
Hgb urine dipstick: NEGATIVE
Ketones, ur: NEGATIVE
Leukocytes, UA: NEGATIVE
Nitrite: NEGATIVE
PH: 6.5 (ref 5.0–8.0)
SPECIFIC GRAVITY, URINE: 1.02 (ref 1.000–1.030)
Total Protein, Urine: NEGATIVE
UROBILINOGEN UA: 0.2 (ref 0.0–1.0)
Urine Glucose: NEGATIVE

## 2014-12-28 MED ORDER — LISINOPRIL-HYDROCHLOROTHIAZIDE 20-12.5 MG PO TABS: 1.0000 | ORAL_TABLET | Freq: Two times a day (BID) | ORAL | Status: DC

## 2014-12-28 MED ORDER — FLUOXETINE HCL 20 MG PO TABS: ORAL_TABLET | ORAL | Status: DC

## 2014-12-28 NOTE — Patient Instructions (Signed)
Please go to the lab for blood work.  I will call you with your results. If your blood work is normal we will follow-up yearly for physicals.  If anything is abnormal we will treat accordingly and get you in for a follow-up.  Please continue medications as directed. You will be contacted by GI to schedule a screening colonoscopy.  Preventive Care for Adults A healthy lifestyle and preventive care can promote health and wellness. Preventive health guidelines for men include the following key practices:  A routine yearly physical is a good way to check with your health care provider about your health and preventative screening. It is a chance to share any concerns and updates on your health and to receive a thorough exam.  Visit your dentist for a routine exam and preventative care every 6 months. Brush your teeth twice a day and floss once a day. Good oral hygiene prevents tooth decay and gum disease.  The frequency of eye exams is based on your age, health, family medical history, use of contact lenses, and other factors. Follow your health care provider's recommendations for frequency of eye exams.  Eat a healthy diet. Foods such as vegetables, fruits, whole grains, low-fat dairy products, and lean protein foods contain the nutrients you need without too many calories. Decrease your intake of foods high in solid fats, added sugars, and salt. Eat the right amount of calories for you.Get information about a proper diet from your health care provider, if necessary.  Regular physical exercise is one of the most important things you can do for your health. Most adults should get at least 150 minutes of moderate-intensity exercise (any activity that increases your heart rate and causes you to sweat) each week. In addition, most adults need muscle-strengthening exercises on 2 or more days a week.  Maintain a healthy weight. The body mass index (BMI) is a screening tool to identify possible weight  problems. It provides an estimate of body fat based on height and weight. Your health care provider can find your BMI and can help you achieve or maintain a healthy weight.For adults 20 years and older:  A BMI below 18.5 is considered underweight.  A BMI of 18.5 to 24.9 is normal.  A BMI of 25 to 29.9 is considered overweight.  A BMI of 30 and above is considered obese.  Maintain normal blood lipids and cholesterol levels by exercising and minimizing your intake of saturated fat. Eat a balanced diet with plenty of fruit and vegetables. Blood tests for lipids and cholesterol should begin at age 58 and be repeated every 5 years. If your lipid or cholesterol levels are high, you are over 50, or you are at high risk for heart disease, you may need your cholesterol levels checked more frequently.Ongoing high lipid and cholesterol levels should be treated with medicines if diet and exercise are not working.  If you smoke, find out from your health care provider how to quit. If you do not use tobacco, do not start.  Lung cancer screening is recommended for adults aged 11-80 years who are at high risk for developing lung cancer because of a history of smoking. A yearly low-dose CT scan of the lungs is recommended for people who have at least a 30-pack-year history of smoking and are a current smoker or have quit within the past 15 years. A pack year of smoking is smoking an average of 1 pack of cigarettes a day for 1 year (for example: 1  pack a day for 30 years or 2 packs a day for 15 years). Yearly screening should continue until the smoker has stopped smoking for at least 15 years. Yearly screening should be stopped for people who develop a health problem that would prevent them from having lung cancer treatment.  If you choose to drink alcohol, do not have more than 2 drinks per day. One drink is considered to be 12 ounces (355 mL) of beer, 5 ounces (148 mL) of wine, or 1.5 ounces (44 mL) of  liquor.  Avoid use of street drugs. Do not share needles with anyone. Ask for help if you need support or instructions about stopping the use of drugs.  High blood pressure causes heart disease and increases the risk of stroke. Your blood pressure should be checked at least every 1-2 years. Ongoing high blood pressure should be treated with medicines, if weight loss and exercise are not effective.  If you are 67-37 years old, ask your health care provider if you should take aspirin to prevent heart disease.  Diabetes screening involves taking a blood sample to check your fasting blood sugar level. This should be done once every 3 years, after age 49, if you are within normal weight and without risk factors for diabetes. Testing should be considered at a younger age or be carried out more frequently if you are overweight and have at least 1 risk factor for diabetes.  Colorectal cancer can be detected and often prevented. Most routine colorectal cancer screening begins at the age of 54 and continues through age 58. However, your health care provider may recommend screening at an earlier age if you have risk factors for colon cancer. On a yearly basis, your health care provider may provide home test kits to check for hidden blood in the stool. Use of a small camera at the end of a tube to directly examine the colon (sigmoidoscopy or colonoscopy) can detect the earliest forms of colorectal cancer. Talk to your health care provider about this at age 70, when routine screening begins. Direct exam of the colon should be repeated every 5-10 years through age 74, unless early forms of precancerous polyps or small growths are found.  People who are at an increased risk for hepatitis B should be screened for this virus. You are considered at high risk for hepatitis B if:  You were born in a country where hepatitis B occurs often. Talk with your health care provider about which countries are considered high  risk.  Your parents were born in a high-risk country and you have not received a shot to protect against hepatitis B (hepatitis B vaccine).  You have HIV or AIDS.  You use needles to inject street drugs.  You live with, or have sex with, someone who has hepatitis B.  You are a man who has sex with other men (MSM).  You get hemodialysis treatment.  You take certain medicines for conditions such as cancer, organ transplantation, and autoimmune conditions.  Hepatitis C blood testing is recommended for all people born from [birth year] through 1965 and any individual with known risks for hepatitis C.  Practice safe sex. Use condoms and avoid high-risk sexual practices to reduce the spread of sexually transmitted infections (STIs). STIs include gonorrhea, chlamydia, syphilis, trichomonas, herpes, HPV, and human immunodeficiency virus (HIV). Herpes, HIV, and HPV are viral illnesses that have no cure. They can result in disability, cancer, and death.  If you are at risk of being infected  with HIV, it is recommended that you take a prescription medicine daily to prevent HIV infection. This is called preexposure prophylaxis (PrEP). You are considered at risk if:  You are a man who has sex with other men (MSM) and have other risk factors.  You are a heterosexual man, are sexually active, and are at increased risk for HIV infection.  You take drugs by injection.  You are sexually active with a partner who has HIV.  Talk with your health care provider about whether you are at high risk of being infected with HIV. If you choose to begin PrEP, you should first be tested for HIV. You should then be tested every 3 months for as long as you are taking PrEP.  A one-time screening for abdominal aortic aneurysm (AAA) and surgical repair of large AAAs by ultrasound are recommended for men ages 66 to 64 years who are current or former smokers.  Healthy men should no longer receive prostate-specific antigen (PSA)  blood tests as part of routine cancer screening. Talk with your health care provider about prostate cancer screening.  Testicular cancer screening is not recommended for adult males who have no symptoms. Screening includes self-exam, a health care provider exam, and other screening tests. Consult with your health care provider about any symptoms you have or any concerns you have about testicular cancer.  Use sunscreen. Apply sunscreen liberally and repeatedly throughout the day. You should seek shade when your shadow is shorter than you. Protect yourself by wearing long sleeves, pants, a wide-brimmed hat, and sunglasses year round, whenever you are outdoors.  Once a month, do a whole-body skin exam, using a mirror to look at the skin on your back. Tell your health care provider about new moles, moles that have irregular borders, moles that are larger than a pencil eraser, or moles that have changed in shape or color.  Stay current with required vaccines (immunizations).  Influenza vaccine. All adults should be immunized every year.  Tetanus, diphtheria, and acellular pertussis (Td, Tdap) vaccine. An adult who has not previously received Tdap or who does not know his vaccine status should receive 1 dose of Tdap. This initial dose should be followed by tetanus and diphtheria toxoids (Td) booster doses every 10 years. Adults with an unknown or incomplete history of completing a 3-dose immunization series with Td-containing vaccines should begin or complete a primary immunization series including a Tdap dose. Adults should receive a Td booster every 10 years.  Varicella vaccine. An adult without evidence of immunity to varicella should receive 2 doses or a second dose if he has previously received 1 dose.  Human papillomavirus (HPV) vaccine. Males aged 64-21 years who have not received the vaccine previously should receive the 3-dose series. Males aged 22-26 years may be immunized. Immunization is  recommended through the age of 55 years for any male who has sex with males and did not get any or all doses earlier. Immunization is recommended for any person with an immunocompromised condition through the age of 42 years if he did not get any or all doses earlier. During the 3-dose series, the second dose should be obtained 4-8 weeks after the first dose. The third dose should be obtained 24 weeks after the first dose and 16 weeks after the second dose.  Zoster vaccine. One dose is recommended for adults aged 65 years or older unless certain conditions are present.  Measles, mumps, and rubella (MMR) vaccine. Adults born before [birth year] generally are considered  immune to measles and mumps. Adults born in [birth year] or later should have 1 or more doses of MMR vaccine unless there is a contraindication to the vaccine or there is laboratory evidence of immunity to each of the three diseases. A routine second dose of MMR vaccine should be obtained at least 28 days after the first dose for students attending postsecondary schools, health care workers, or international travelers. People who received inactivated measles vaccine or an unknown type of measles vaccine during 1963-1967 should receive 2 doses of MMR vaccine. People who received inactivated mumps vaccine or an unknown type of mumps vaccine before 1979 and are at high risk for mumps infection should consider immunization with 2 doses of MMR vaccine. Unvaccinated health care workers born before 50 who lack laboratory evidence of measles, mumps, or rubella immunity or laboratory confirmation of disease should consider measles and mumps immunization with 2 doses of MMR vaccine or rubella immunization with 1 dose of MMR vaccine.  Pneumococcal 13-valent conjugate (PCV13) vaccine. When indicated, a person who is uncertain of his immunization history and has no record of immunization should receive the PCV13 vaccine. An adult aged 24 years or older who has certain  medical conditions and has not been previously immunized should receive 1 dose of PCV13 vaccine. This PCV13 should be followed with a dose of pneumococcal polysaccharide (PPSV23) vaccine. The PPSV23 vaccine dose should be obtained at least 8 weeks after the dose of PCV13 vaccine. An adult aged 70 years or older who has certain medical conditions and previously received 1 or more doses of PPSV23 vaccine should receive 1 dose of PCV13. The PCV13 vaccine dose should be obtained 1 or more years after the last PPSV23 vaccine dose.  Pneumococcal polysaccharide (PPSV23) vaccine. When PCV13 is also indicated, PCV13 should be obtained first. All adults aged 14 years and older should be immunized. An adult younger than age 75 years who has certain medical conditions should be immunized. Any person who resides in a nursing home or long-term care facility should be immunized. An adult smoker should be immunized. People with an immunocompromised condition and certain other conditions should receive both PCV13 and PPSV23 vaccines. People with human immunodeficiency virus (HIV) infection should be immunized as soon as possible after diagnosis. Immunization during chemotherapy or radiation therapy should be avoided. Routine use of PPSV23 vaccine is not recommended for American Indians, [place] Natives, or people younger than 65 years unless there are medical conditions that require PPSV23 vaccine. When indicated, people who have unknown immunization and have no record of immunization should receive PPSV23 vaccine. One-time revaccination 5 years after the first dose of PPSV23 is recommended for people aged 19-64 years who have chronic kidney failure, nephrotic syndrome, asplenia, or immunocompromised conditions. People who received 1-2 doses of PPSV23 before age 41 years should receive another dose of PPSV23 vaccine at age 7 years or later if at least 5 years have passed since the previous dose. Doses of PPSV23 are not needed for  people immunized with PPSV23 at or after age 4 years.  Meningococcal vaccine. Adults with asplenia or persistent complement component deficiencies should receive 2 doses of quadrivalent meningococcal conjugate (MenACWY-D) vaccine. The doses should be obtained at least 2 months apart. Microbiologists working with certain meningococcal bacteria, [employer], people at risk during an outbreak, and people who travel to or live in countries with a high rate of meningitis should be immunized. A first-year college student up through age 60 years who is living in  a residence hall should receive a dose if he did not receive a dose on or after his 16th birthday. Adults who have certain high-risk conditions should receive one or more doses of vaccine.  Hepatitis A vaccine. Adults who wish to be protected from this disease, have certain high-risk conditions, work with hepatitis A-infected animals, work in hepatitis A research labs, or travel to or work in countries with a high rate of hepatitis A should be immunized. Adults who were previously unvaccinated and who anticipate close contact with an international adoptee during the first 60 days after arrival in the [place] from a country with a high rate of hepatitis A should be immunized.  Hepatitis B vaccine. Adults should be immunized if they wish to be protected from this disease, have certain high-risk conditions, may be exposed to blood or other infectious body fluids, are household contacts or sex partners of hepatitis B positive people, are clients or workers in certain care facilities, or travel to or work in countries with a high rate of hepatitis B.  Haemophilus influenzae type b (Hib) vaccine. A previously unvaccinated person with asplenia or sickle cell disease or having a scheduled splenectomy should receive 1 dose of Hib vaccine. Regardless of previous immunization, a recipient of a hematopoietic stem cell transplant should receive a 3-dose  series 6-12 months after his successful transplant. Hib vaccine is not recommended for adults with HIV infection. Preventive Service / Frequency Ages 48 to 40  Blood pressure check.** / Every 1 to 2 years.  Lipid and cholesterol check.** / Every 5 years beginning at age 8.  Hepatitis C blood test.** / For any individual with known risks for hepatitis C.  Skin self-exam. / Monthly.  Influenza vaccine. / Every year.  Tetanus, diphtheria, and acellular pertussis (Tdap, Td) vaccine.** / Consult your health care provider. 1 dose of Td every 10 years.  Varicella vaccine.** / Consult your health care provider.  HPV vaccine. / 3 doses over 6 months, if 63 or younger.  Measles, mumps, rubella (MMR) vaccine.** / You need at least 1 dose of MMR if you were born in 1957 or later. You may also need a second dose.  Pneumococcal 13-valent conjugate (PCV13) vaccine.** / Consult your health care provider.  Pneumococcal polysaccharide (PPSV23) vaccine.** / 1 to 2 doses if you smoke cigarettes or if you have certain conditions.  Meningococcal vaccine.** / 1 dose if you are age 10 to 70 years and a Market researcher living in a residence hall, or have one of several medical conditions. You may also need additional booster doses.  Hepatitis A vaccine.** / Consult your health care provider.  Hepatitis B vaccine.** / Consult your health care provider.  Haemophilus influenzae type b (Hib) vaccine.** / Consult your health care provider. Ages 59 to 45  Blood pressure check.** / Every 1 to 2 years.  Lipid and cholesterol check.** / Every 5 years beginning at age 48.  Lung cancer screening. / Every year if you are aged 60-80 years and have a 30-pack-year history of smoking and currently smoke or have quit within the past 15 years. Yearly screening is stopped once you have quit smoking for at least 15 years or develop a health problem that would prevent you from having lung cancer  treatment.  Fecal occult blood test (FOBT) of stool. / Every year beginning at age 45 and continuing until age 57. You may not have to do this test if you get a colonoscopy every 10  years.  Flexible sigmoidoscopy** or colonoscopy.** / Every 5 years for a flexible sigmoidoscopy or every 10 years for a colonoscopy beginning at age 73 and continuing until age 23.  Hepatitis C blood test.** / For all people born from [birth year] through 1965 and any individual with known risks for hepatitis C.  Skin self-exam. / Monthly.  Influenza vaccine. / Every year.  Tetanus, diphtheria, and acellular pertussis (Tdap/Td) vaccine.** / Consult your health care provider. 1 dose of Td every 10 years.  Varicella vaccine.** / Consult your health care provider.  Zoster vaccine.** / 1 dose for adults aged 82 years or older.  Measles, mumps, rubella (MMR) vaccine.** / You need at least 1 dose of MMR if you were born in 1959 or later. You may also need a second dose.  Pneumococcal 13-valent conjugate (PCV13) vaccine.** / Consult your health care provider.  Pneumococcal polysaccharide (PPSV23) vaccine.** / 1 to 2 doses if you smoke cigarettes or if you have certain conditions.  Meningococcal vaccine.** / Consult your health care provider.  Hepatitis A vaccine.** / Consult your health care provider.  Hepatitis B vaccine.** / Consult your health care provider.  Haemophilus influenzae type b (Hib) vaccine.** / Consult your health care provider. Ages 49 and over  Blood pressure check.** / Every 1 to 2 years.  Lipid and cholesterol check.**/ Every 5 years beginning at age 33.  Lung cancer screening. / Every year if you are aged 10-80 years and have a 30-pack-year history of smoking and currently smoke or have quit within the past 15 years. Yearly screening is stopped once you have quit smoking for at least 15 years or develop a health problem that would prevent you from having lung cancer treatment.  Fecal occult  blood test (FOBT) of stool. / Every year beginning at age 40 and continuing until age 53. You may not have to do this test if you get a colonoscopy every 10 years.  Flexible sigmoidoscopy** or colonoscopy.** / Every 5 years for a flexible sigmoidoscopy or every 10 years for a colonoscopy beginning at age 7 and continuing until age 43.  Hepatitis C blood test.** / For all people born from [birth year] through 1967 and any individual with known risks for hepatitis C.  Abdominal aortic aneurysm (AAA) screening.** / A one-time screening for ages 24 to 27 years who are current or former smokers.  Skin self-exam. / Monthly.  Influenza vaccine. / Every year.  Tetanus, diphtheria, and acellular pertussis (Tdap/Td) vaccine.** / 1 dose of Td every 10 years.  Varicella vaccine.** / Consult your health care provider.  Zoster vaccine.** / 1 dose for adults aged 46 years or older.  Pneumococcal 13-valent conjugate (PCV13) vaccine.** / Consult your health care provider.  Pneumococcal polysaccharide (PPSV23) vaccine.** / 1 dose for all adults aged 78 years and older.  Meningococcal vaccine.** / Consult your health care provider.  Hepatitis A vaccine.** / Consult your health care provider.  Hepatitis B vaccine.** / Consult your health care provider.  Haemophilus influenzae type b (Hib) vaccine.** / Consult your health care provider. **Family history and personal history of risk and conditions may change your health care provider's recommendations. Document Released: 05/19/2001 Document Revised: 03/28/2013 Document Reviewed: 08/18/2010 [facility] Patient Information 2015 [place], [place]. This information is not intended to replace advice given to you by your health care provider. Make sure you discuss any questions you have with your health care provider.

## 2014-12-28 NOTE — Progress Notes (Signed)
Patient presents to clinic today for annual exam.  Patient is fasting for labs.  Acute Concerns: Is in need of medication refills.  Chronic Issues: Anxiety and Depression -- Is taking Fluoxetine daily as directed. Endorses symptoms are markedly improved with medication. Denies breakthrough symptoms.  Hypertension -- Is taking BP medication as directed. Patient denies chest pain, palpitations, lightheadedness, dizziness, vision changes or frequent headaches.  BP Readings from Last 3 Encounters:  12/28/14 102/78  08/07/14 126/88  07/24/14 119/69    Health Maintenance: Dental -- up-to-date Vision -- up-to-date Immunizations -- Tetanus up-to-date. Will get flu shot today. Colonoscopy -- Will agree to referral for colonoscopy.  Past Medical History  Diagnosis Date  . Hypertension   . High cholesterol   . Allergy     seasonal  . Sinusitis     Past Surgical History  Procedure Laterality Date  . Unremarkable      Current Outpatient Prescriptions on File Prior to Visit  Medication Sig Dispense Refill  . acetaminophen (TYLENOL) 325 MG tablet Take 650 mg by mouth every 6 (six) hours as needed.    . fluticasone (FLONASE) 50 MCG/ACT nasal spray Place 1 spray into both nostrils daily.  1  . Multiple Vitamins-Minerals (CENTRUM SILVER ADULT 50+) TABS Take 1 tablet by mouth daily.    Marland Kitchen MOVIPREP 100 G SOLR Take 1 kit (200 g total) by mouth once. moviprep as directed. No substitutions (Patient not taking: Reported on 12/26/2014) 1 kit 0  . [DISCONTINUED] lisinopril (PRINIVIL,ZESTRIL) 20 MG tablet Take 1 tablet (20 mg total) by mouth daily. 30 tablet 0   No current facility-administered medications on file prior to visit.    No Known Allergies  Family History  Problem Relation Age of Onset  . Hypertension Mother     Living  . Thyroid disease Mother   . Diabetes Father 71    Deceased  . Obesity Father   . Heart attack Father   . Diabetes Other     Paternal Side of Family    . Alzheimer's disease Maternal Grandmother   . Diabetes Sister   . Healthy Son     x3  . Colon cancer Neg Hx   . Rectal cancer Neg Hx   . Stomach cancer Neg Hx     Social History   Social History  . Marital Status: Married    Spouse Name: N/A  . Number of Children: N/A  . Years of Education: N/A   Occupational History  . Not on file.   Social History Main Topics  . Smoking status: Never Smoker   . Smokeless tobacco: Never Used  . Alcohol Use: 6.0 oz/week    10 Shots of liquor per week  . Drug Use: No  . Sexual Activity: Not on file   Other Topics Concern  . Not on file   Social History Narrative   Review of Systems  Constitutional: Negative for fever and weight loss.  HENT: Negative for ear discharge, ear pain, hearing loss and tinnitus.   Eyes: Negative for blurred vision, double vision, photophobia and pain.  Respiratory: Negative for cough and shortness of breath.   Cardiovascular: Negative for chest pain and palpitations.  Gastrointestinal: Negative for heartburn, nausea, vomiting, abdominal pain, diarrhea, constipation, blood in stool and melena.  Genitourinary: Negative for dysuria, urgency, frequency, hematuria and flank pain.  Musculoskeletal: Negative for falls.  Neurological: Negative for dizziness, loss of consciousness and headaches.  Endo/Heme/Allergies: Negative for environmental allergies.  Psychiatric/Behavioral: Negative  for depression, suicidal ideas, hallucinations and substance abuse. The patient is not nervous/anxious and does not have insomnia.    BP 102/78 mmHg  Pulse 67  Temp(Src) 97.9 F (36.6 C) (Oral)  Resp 16  Ht 6' (1.829 m)  Wt 203 lb 4 oz (92.194 kg)  BMI 27.56 kg/m2  SpO2 97%  Physical Exam  Constitutional: He is oriented to person, place, and time and well-developed, well-nourished, and in no distress.  HENT:  Head: Normocephalic and atraumatic.  Right Ear: External ear normal.  Left Ear: External ear normal.  Nose: Nose  normal.  Mouth/Throat: Oropharynx is clear and moist. No oropharyngeal exudate.  Eyes: Conjunctivae and EOM are normal. Pupils are equal, round, and reactive to light.  Neck: Neck supple. No thyromegaly present.  Cardiovascular: Normal rate, regular rhythm, normal heart sounds and intact distal pulses.   Pulmonary/Chest: Effort normal and breath sounds normal. No respiratory distress. He has no wheezes. He has no rales. He exhibits no tenderness.  Abdominal: Soft. Bowel sounds are normal. He exhibits no distension and no mass. There is no tenderness. There is no rebound and no guarding.  Genitourinary: Testes/scrotum normal.  Patient defers to Urology  Lymphadenopathy:    He has no cervical adenopathy.  Neurological: He is alert and oriented to person, place, and time.  Skin: Skin is warm and dry. No rash noted.  Psychiatric: Affect normal.  Vitals reviewed.  Recent Results (from the past 2160 hour(s))  CBC     Status: None   Collection Time: 12/28/14  9:15 AM  Result Value Ref Range   WBC 6.3 4.0 - 10.5 K/uL   RBC 4.64 4.22 - 5.81 Mil/uL   Platelets 374.0 150.0 - 400.0 K/uL   Hemoglobin 14.7 13.0 - 17.0 g/dL   HCT 43.1 39.0 - 52.0 %   MCV 92.8 78.0 - 100.0 fl   MCHC 34.0 30.0 - 36.0 g/dL   RDW 12.5 11.5 - 15.5 %  Comp Met (CMET)     Status: Abnormal   Collection Time: 12/28/14  9:15 AM  Result Value Ref Range   Sodium 137 135 - 145 mEq/L   Potassium 3.8 3.5 - 5.1 mEq/L   Chloride 99 96 - 112 mEq/L   CO2 31 19 - 32 mEq/L   Glucose, Bld 116 (H) 70 - 99 mg/dL   BUN 12 6 - 23 mg/dL   Creatinine, Ser 0.84 0.40 - 1.50 mg/dL   Total Bilirubin 0.4 0.2 - 1.2 mg/dL   Alkaline Phosphatase 74 39 - 117 U/L   AST 44 (H) 0 - 37 U/L   ALT 50 0 - 53 U/L   Total Protein 7.1 6.0 - 8.3 g/dL   Albumin 4.3 3.5 - 5.2 g/dL   Calcium 9.4 8.4 - 10.5 mg/dL   GFR 102.29 >60.00 mL/min  Hemoglobin A1c     Status: None   Collection Time: 12/28/14  9:15 AM  Result Value Ref Range   Hgb A1c MFr Bld  5.9 4.6 - 6.5 %    Comment: Glycemic Control Guidelines for People with Diabetes:Non Diabetic:  <6%Goal of Therapy: <7%Additional Action Suggested:  >8%   Lipid Profile     Status: Abnormal   Collection Time: 12/28/14  9:15 AM  Result Value Ref Range   Cholesterol 322 (H) 0 - 200 mg/dL    Comment: ATP III Classification       Desirable:  < 200 mg/dL  Borderline High:  200 - 239 mg/dL          High:  > = 240 mg/dL   Triglycerides (H) 0.0 - 149.0 mg/dL    557.0 Triglyceride is over 400; calculations on Lipids are invalid.    Comment: Normal:  <150 mg/dLBorderline High:  150 - 199 mg/dL   HDL 35.50 (L) >39.00 mg/dL   Total CHOL/HDL Ratio 9     Comment:                Men          Women1/2 Average Risk     3.4          3.3Average Risk          5.0          4.42X Average Risk          9.6          7.13X Average Risk          15.0          11.0                      LDL cholesterol, direct     Status: None   Collection Time: 12/28/14  9:15 AM  Result Value Ref Range   Direct LDL 190.0 mg/dL    Comment: Optimal:  <100 mg/dLNear or Above Optimal:  100-129 mg/dLBorderline High:  130-159 mg/dLHigh:  160-189 mg/dLVery High:  >190 mg/dL  Urinalysis, Routine w reflex microscopic (not at [facility])     Status: None   Collection Time: 12/28/14  9:15 AM  Result Value Ref Range   Color, Urine YELLOW Yellow;Lt. Yellow   APPearance CLEAR Clear   Specific Gravity, Urine 1.020 1.000-1.030   pH 6.5 5.0 - 8.0   Total Protein, Urine NEGATIVE Negative   Urine Glucose NEGATIVE Negative   Ketones, ur NEGATIVE Negative   Bilirubin Urine NEGATIVE Negative   Hgb urine dipstick NEGATIVE Negative   Urobilinogen, UA 0.2 0.0 - 1.0   Leukocytes, UA NEGATIVE Negative   Nitrite NEGATIVE Negative   WBC, UA 0-2/hpf 0-2/hpf   RBC / HPF 0-2/hpf 0-2/hpf   Squamous Epithelial / LPF Rare(0-4/hpf) Rare(0-4/hpf)    Assessment/Plan: Visit for preventive health examination Depression screen negative. Health  Maintenance reviewed -- Flu shot given. Referral to GI for screening colonoscopy placed. Preventive schedule discussed and handout given in AVS. Will obtain fasting labs today.   Colon cancer screening Referral to GI placed.  Hypertension Asymptomatic. Well-controlled. Continue current regimen. Will check BMP today.  Anxiety and depression Stable. Medication refilled. Follow-up in 6 months.

## 2014-12-29 NOTE — Assessment & Plan Note (Signed)
Stable. Medication refilled. Follow-up in 6 months.

## 2014-12-29 NOTE — Assessment & Plan Note (Addendum)
Depression screen negative. Health Maintenance reviewed -- Flu shot given. Referral to GI for screening colonoscopy placed. Preventive schedule discussed and handout given in AVS. EKG obtained revealing sinus bradycardia at rate 54 bpm. Asymptomatic. Will obtain fasting labs today.

## 2014-12-29 NOTE — Assessment & Plan Note (Signed)
Referral to GI placed

## 2014-12-29 NOTE — Assessment & Plan Note (Signed)
Asymptomatic. Well-controlled. Continue current regimen. Will check BMP today.

## 2014-12-31 ENCOUNTER — Other Ambulatory Visit: Payer: Self-pay | Admitting: Physician Assistant

## 2014-12-31 DIAGNOSIS — E781 Pure hyperglyceridemia: Secondary | ICD-10-CM

## 2014-12-31 MED ORDER — FENOFIBRATE 160 MG PO TABS: 160.0000 mg | ORAL_TABLET | Freq: Every day | ORAL | Status: DC

## 2015-01-01 NOTE — Addendum Note (Signed)
Addended by: Raiford Noble on: 01/01/2015 01:59 PM   Modules accepted: Miquel Dunn

## 2015-01-11 NOTE — Telephone Encounter (Signed)
Emailed charge correction to have no-show charge removed, Patient informed

## 2015-03-14 ENCOUNTER — Telehealth: Payer: Self-pay | Admitting: Physician Assistant

## 2015-03-14 NOTE — Telephone Encounter (Signed)
Relation to [id number] Call back number: [phone]   Reason for call: patient states cholesterol medication (does not know the name) makes him sick. In need of clinical advice

## 2015-03-14 NOTE — Telephone Encounter (Signed)
Called patient who states the medication is Fenofibrate that's causing the stomach upset.

## 2015-03-15 MED ORDER — OMEGA-3-ACID ETHYL ESTERS 1 G PO CAPS: 1.0000 g | ORAL_CAPSULE | Freq: Two times a day (BID) | ORAL | Status: DC

## 2015-03-15 NOTE — Telephone Encounter (Signed)
Stop the fenofibrate. I have sent in Rx for Lovaza to take as directed for Triglycerides. Follow-up with me in 1 month.

## 2015-03-15 NOTE — Telephone Encounter (Signed)
Left message for patient to return call.

## 2015-03-18 ENCOUNTER — Telehealth: Payer: Self-pay | Admitting: *Deleted

## 2015-03-18 NOTE — Telephone Encounter (Signed)
Patient returning your call.

## 2015-03-18 NOTE — Telephone Encounter (Signed)
Spoke with patient about cost of medication with Ins Approval and he does not want to start medication, suggested amybe "going back to previous that was going to be $35 per month/SLS Please Advise.

## 2015-03-18 NOTE — Telephone Encounter (Signed)
Not sure what he is referring to. Fenofibrate was causing him to feel bad which is why we started looking for an alternative. We can have him start a daily krill oil to see if it will help with TGL -- If we start this I want to see him in 1 month to check things.

## 2015-03-18 NOTE — Telephone Encounter (Signed)
Called patient again with results regarding new medication called in to pharmacy. Letter also mailed.

## 2015-03-18 NOTE — Telephone Encounter (Signed)
Received fax from [facility] requesting PA for Lovaza 1 gm, initiated via Cover my Meds and received Approval [[id number]]; called pharmacy and had this Rx run through system for acceptance, covered but will cost patient $60 monthly for medication. LMOM with contact name and number for return call RE: cost of medication and further inquiry as to if a change is needed/SLS

## 2015-03-19 NOTE — Telephone Encounter (Signed)
LMOM with contact name and number [for return call, if needed] RE: Rx vs OTC and further provider instructions/SLS

## 2015-03-27 ENCOUNTER — Telehealth: Payer: Self-pay | Admitting: Physician Assistant

## 2015-03-27 NOTE — Telephone Encounter (Signed)
Patient returning your call.

## 2015-03-27 NOTE — Telephone Encounter (Signed)
Spoke with the pt and he wanted to know how many times a day he is to take the [employer].  Informed the pt-per Einar Pheasant he is to take 1 capsule per daily.   Pt verbalized understanding and agreed.//AB/CMA

## 2015-03-27 NOTE — Telephone Encounter (Signed)
Relation to [id number] Call back number:[phone]   Reason for call:  Patient in need of clinical advice regarding "creola" regarding his Triglyceride. Patient did not know the name of medication and states he wants to know how many times a day he needs to take medication. Please advise

## 2015-03-27 NOTE — Telephone Encounter (Signed)
Spoke with patient/SLS

## 2015-08-29 ENCOUNTER — Other Ambulatory Visit: Payer: Self-pay | Admitting: Physician Assistant

## 2015-08-29 NOTE — Telephone Encounter (Signed)
Can be reached: [phone] Pharmacy: [place], [place]  Reason for call: pt called stating only has enough til Sunday of lisinopril.

## 2015-09-06 ENCOUNTER — Ambulatory Visit: Payer: 59 | Admitting: Physician Assistant

## 2015-09-20 ENCOUNTER — Ambulatory Visit (INDEPENDENT_AMBULATORY_CARE_PROVIDER_SITE_OTHER): Payer: 59 | Admitting: Physician Assistant

## 2015-09-20 ENCOUNTER — Encounter: Payer: Self-pay | Admitting: Physician Assistant

## 2015-09-20 VITALS — BP 132/82 | HR 65 | Resp 16 | Ht 72.0 in | Wt 212.4 lb

## 2015-09-20 DIAGNOSIS — F32A Depression, unspecified: Secondary | ICD-10-CM

## 2015-09-20 DIAGNOSIS — I10 Essential (primary) hypertension: Secondary | ICD-10-CM

## 2015-09-20 DIAGNOSIS — F418 Other specified anxiety disorders: Secondary | ICD-10-CM

## 2015-09-20 DIAGNOSIS — E785 Hyperlipidemia, unspecified: Secondary | ICD-10-CM

## 2015-09-20 DIAGNOSIS — F329 Major depressive disorder, single episode, unspecified: Secondary | ICD-10-CM

## 2015-09-20 DIAGNOSIS — F419 Anxiety disorder, unspecified: Principal | ICD-10-CM

## 2015-09-20 LAB — LIPID PANEL
CHOL/HDL RATIO: 8
Cholesterol: 338 mg/dL — ABNORMAL HIGH (ref 0–200)
HDL: 44.2 mg/dL (ref >39.00)
Triglycerides: 417 mg/dL — ABNORMAL HIGH (ref 0.0–149.0)

## 2015-09-20 LAB — COMPREHENSIVE METABOLIC PANEL
ALBUMIN: 4.2 g/dL (ref 3.5–5.2)
ALK PHOS: 73 U/L (ref 39–117)
ALT: 40 U/L (ref 0–53)
AST: 33 U/L (ref 0–37)
BUN: 15 mg/dL (ref 6–23)
CALCIUM: 9 mg/dL (ref 8.4–10.5)
CO2: 30 mEq/L (ref 19–32)
Chloride: 99 mEq/L (ref 96–112)
Creatinine, Ser: 0.89 mg/dL (ref 0.40–1.50)
GFR: 95.41 mL/min (ref >60.00)
GLUCOSE: 119 mg/dL — AB (ref 70–99)
POTASSIUM: 4.2 meq/L (ref 3.5–5.1)
Sodium: 136 mEq/L (ref 135–145)
TOTAL PROTEIN: 6.9 g/dL (ref 6.0–8.3)
Total Bilirubin: 0.3 mg/dL (ref 0.2–1.2)

## 2015-09-20 LAB — LDL CHOLESTEROL, DIRECT: LDL DIRECT: 216 mg/dL

## 2015-09-20 NOTE — Progress Notes (Signed)
Pre visit review using our clinic review tool, if applicable. No additional management support is needed unless otherwise documented below in the visit note/SLS  

## 2015-09-20 NOTE — Patient Instructions (Signed)
Please go to the lab for blood work. I will call you with results. Please continue medications as directed.   Stay active and eat a well-balanced diet.  Follow-up with me in 6 months for CPE.

## 2015-09-20 NOTE — Progress Notes (Signed)
Patient presents to clinic today for follow-up.  Hypertension -- Is currently on a regimen of lisinopril-HCTZ. Patient denies chest pain, palpitations, lightheadedness, dizziness, vision changes or frequent headaches.  BP Readings from Last 3 Encounters:  09/20/15 132/82  12/28/14 102/78  08/07/14 126/88   Hyperlipidemia -- Is currently on Lovaza daily. Body mass index is 28.8 kg/(m^2). Endorses well-balanced diet. Is exercising when he can but this has been limited due to recent knee procedure.  Anxiety and Depression -- Is currently on Fluoxetine. Taking daily as directed. Endorses great mood overall. Is not being easily stressed out. Denies SI/HI.   Past Medical History  Diagnosis Date  . Hypertension   . High cholesterol   . Allergy     seasonal  . Sinusitis     Current Outpatient Prescriptions on File Prior to Visit  Medication Sig Dispense Refill  . FLUoxetine (PROZAC) 20 MG tablet Take 1 tablet by mouth daily 90 tablet 1  . fluticasone (FLONASE) 50 MCG/ACT nasal spray Place 1 spray into both nostrils daily.  1  . ibuprofen (ADVIL,MOTRIN) 200 MG tablet Take 200 mg by mouth every 6 (six) hours as needed.    Marland Kitchen lisinopril-hydrochlorothiazide (PRINZIDE,ZESTORETIC) 20-12.5 MG tablet take 1 tablet by mouth twice a day 180 tablet 1  . Multiple Vitamins-Minerals (CENTRUM SILVER ADULT 50+) TABS Take 1 tablet by mouth daily.    Marland Kitchen omega-3 acid ethyl esters (LOVAZA) 1 G capsule Take 1 capsule (1 g total) by mouth 2 (two) times daily. 60 capsule 1  . MOVIPREP 100 G SOLR Take 1 kit (200 g total) by mouth once. moviprep as directed. No substitutions (Patient not taking: Reported on 12/26/2014) 1 kit 0  . [DISCONTINUED] lisinopril (PRINIVIL,ZESTRIL) 20 MG tablet Take 1 tablet (20 mg total) by mouth daily. 30 tablet 0   No current facility-administered medications on file prior to visit.    No Known Allergies  Family History  Problem Relation Age of Onset  . Hypertension Mother    Living  . Thyroid disease Mother   . Diabetes Father 20    Deceased  . Obesity Father   . Heart attack Father   . Diabetes Other     Paternal Side of Family  . Alzheimer's disease Maternal Grandmother   . Diabetes Sister   . Healthy Son     x3  . Colon cancer Neg Hx   . Rectal cancer Neg Hx   . Stomach cancer Neg Hx     Social History   Social History  . Marital Status: Married    Spouse Name: N/A  . Number of Children: N/A  . Years of Education: N/A   Social History Main Topics  . Smoking status: Never Smoker   . Smokeless tobacco: Never Used  . Alcohol Use: 6.0 oz/week    10 Shots of liquor per week  . Drug Use: No  . Sexual Activity: Not Asked   Other Topics Concern  . None   Social History Narrative   Review of Systems - See HPI.  All other ROS are negative.  BP 132/82 mmHg  Pulse 65  Resp 16  Ht 6' (1.829 m)  Wt 212 lb 6 oz (96.333 kg)  BMI 28.80 kg/m2  SpO2 98%  Physical Exam  Constitutional: He is oriented to person, place, and time and well-developed, well-nourished, and in no distress.  HENT:  Head: Normocephalic and atraumatic.  Eyes: Conjunctivae are normal.  Cardiovascular: Normal rate, regular rhythm, normal heart sounds  and intact distal pulses.   Pulmonary/Chest: Effort normal and breath sounds normal. No respiratory distress. He has no wheezes. He has no rales. He exhibits no tenderness.  Neurological: He is alert and oriented to person, place, and time.  Skin: Skin is warm and dry. No rash noted.  Psychiatric: Affect normal.  Vitals reviewed.  No results found for this or any previous visit (from the past 2160 hour(s)).  Assessment/Plan: 1. Anxiety and depression Doing very well on current regimen. Will continue. FU 6 months.  2. Essential hypertension Well-controlled. Asymptomatic. Will check CMP today. - Comp Met (CMET)  3. Hyperlipidemia Is taking Lovaza as directed. Will repeat lipid panel today. - Lipid panel   Leeanne Rio, PA-C

## 2015-10-02 ENCOUNTER — Telehealth: Payer: Self-pay | Admitting: *Deleted

## 2015-10-02 MED ORDER — ATORVASTATIN CALCIUM 40 MG PO TABS: 20.0000 mg | ORAL_TABLET | Freq: Every evening | ORAL | Status: DC

## 2015-10-02 NOTE — Telephone Encounter (Signed)
Patient informed, understood & agreed; new Rx to pharmacy/SLS 06/28

## 2015-10-02 NOTE — Telephone Encounter (Signed)
-----   Message from Brunetta Jeans, PA-C sent at 09/22/2015  9:22 PM EDT ----- Cholesterol elevated significantly with LDL above 200. Should be < 130. He is almost at double the average risk for stroke and heart attack with these numbers. Would like to start Lipitor 40 mg daily. Would have him take 1/2 tablet daily for 3-4 weeks. FU in office for assessment. If tolerating well at that time, will increase to the whole tablet daily.

## 2015-10-24 ENCOUNTER — Telehealth: Payer: Self-pay | Admitting: Physician Assistant

## 2015-10-24 NOTE — Telephone Encounter (Signed)
Not a typical side effect and as such I would recommend he come in for assessment.

## 2015-10-24 NOTE — Telephone Encounter (Signed)
°  Relation to [id number] Call back [phone] Pharmacy:  Reason for call: pt states his fingers are swollen since he started taking the lipitor and omega 3 800 mg.  States fingers went numb, and he need advice on what to do and wants to know if it could be the lipitor that is causes it.

## 2015-10-29 NOTE — Telephone Encounter (Signed)
Please follow-up with patient.  Thank you.

## 2015-10-29 NOTE — Telephone Encounter (Signed)
lvm waiting on pt to call back to schedule appointment

## 2015-10-30 NOTE — Telephone Encounter (Signed)
LMOM with contact name and number for return call RE: scheduling appointment for A&E per provider instructions/SLS 07/26

## 2015-10-31 NOTE — Telephone Encounter (Signed)
Spoke to the patient and he has stopped taking his medications and symptoms are gone now.  He is currently out of town on vacation and once back will call to schedule an appointment with provider to discuss.

## 2015-12-23 ENCOUNTER — Telehealth: Payer: Self-pay | Admitting: Physician Assistant

## 2015-12-23 NOTE — Telephone Encounter (Signed)
Pt called in. He says that he is currently taking FLUoxetine But would like to change medication to generic Zolof if possible. Pt has scheduled a medication F/U visit for this Friday 9/22.   Please advise.

## 2015-12-23 NOTE — Telephone Encounter (Signed)
We can discuss at visit

## 2015-12-27 ENCOUNTER — Ambulatory Visit (HOSPITAL_BASED_OUTPATIENT_CLINIC_OR_DEPARTMENT_OTHER)
Admission: RE | Admit: 2015-12-27 | Discharge: 2015-12-27 | Disposition: A | Discharge status: Home / Self Care | Payer: 59 | Source: Ambulatory Visit | Attending: Physician Assistant | Admitting: Physician Assistant

## 2015-12-27 ENCOUNTER — Encounter: Payer: Self-pay | Admitting: Physician Assistant

## 2015-12-27 ENCOUNTER — Ambulatory Visit (INDEPENDENT_AMBULATORY_CARE_PROVIDER_SITE_OTHER): Payer: 59 | Admitting: Physician Assistant

## 2015-12-27 VITALS — BP 119/78 | HR 68 | Temp 98.3°F | Resp 16 | Ht 72.0 in | Wt 211.1 lb

## 2015-12-27 DIAGNOSIS — M5030 Other cervical disc degeneration, unspecified cervical region: Secondary | ICD-10-CM | POA: Diagnosis not present

## 2015-12-27 DIAGNOSIS — M5010 Cervical disc disorder with radiculopathy, unspecified cervical region: Secondary | ICD-10-CM | POA: Diagnosis not present

## 2015-12-27 DIAGNOSIS — E785 Hyperlipidemia, unspecified: Secondary | ICD-10-CM | POA: Diagnosis not present

## 2015-12-27 DIAGNOSIS — F329 Major depressive disorder, single episode, unspecified: Secondary | ICD-10-CM

## 2015-12-27 DIAGNOSIS — F419 Anxiety disorder, unspecified: Principal | ICD-10-CM

## 2015-12-27 DIAGNOSIS — F418 Other specified anxiety disorders: Secondary | ICD-10-CM

## 2015-12-27 DIAGNOSIS — Z23 Encounter for immunization: Secondary | ICD-10-CM

## 2015-12-27 DIAGNOSIS — F32A Depression, unspecified: Secondary | ICD-10-CM

## 2015-12-27 HISTORY — DX: Hyperlipidemia, unspecified: E78.5

## 2015-12-27 HISTORY — DX: Cervical disc disorder with radiculopathy, unspecified cervical region: M50.10

## 2015-12-27 LAB — LIPID PANEL
Cholesterol: 199 mg/dL (ref 0–200)
HDL: 39.7 mg/dL (ref >39.00)
NonHDL: 158.89
TRIGLYCERIDES: 238 mg/dL — AB (ref 0.0–149.0)
Total CHOL/HDL Ratio: 5
VLDL: 47.6 mg/dL — AB (ref 0.0–40.0)

## 2015-12-27 LAB — HEPATIC FUNCTION PANEL
ALBUMIN: 4.2 g/dL (ref 3.5–5.2)
ALT: 42 U/L (ref 0–53)
AST: 40 U/L — AB (ref 0–37)
Alkaline Phosphatase: 75 U/L (ref 39–117)
BILIRUBIN TOTAL: 0.4 mg/dL (ref 0.2–1.2)
Bilirubin, Direct: 0 mg/dL (ref 0.0–0.3)
Total Protein: 7.1 g/dL (ref 6.0–8.3)

## 2015-12-27 LAB — LDL CHOLESTEROL, DIRECT: LDL DIRECT: 135 mg/dL

## 2015-12-27 MED ORDER — SERTRALINE HCL 50 MG PO TABS: ORAL_TABLET | ORAL | 3 refills | Status: DC

## 2015-12-27 NOTE — Assessment & Plan Note (Signed)
Will begin Sertraline 25 mg daily x 2 weeks, then increase to 50 mg daily. FU scheduled.

## 2015-12-27 NOTE — Patient Instructions (Addendum)
Please go to the lab for blood work. I will call with your results. Take a break from the lipitor over the next week to see if joint aches improve significantly. If not, then it is not the medication. I will look into the Livalo for you.  Please go downstairs for x-ray. We will call with your results. Try to get a new pillow for stomach sleeping.  The improvement in neck alignment while sleeping will hopefully help with symptoms.

## 2015-12-27 NOTE — Progress Notes (Signed)
Patient presents to clinic today for follow-up of hyperlipidemia and anxiety and depression.  Hyperlipidemia -- Patient currently on Lipitor 40 mg daily due to LDL levels > 200. Is taking as directed. Notes some arthralgias in hands bilaterally. Denies increased myalgias/arthralgias elsewhere. Patient is watching diet and trying to stay active.  Anxiety and Depression -- Noted symptom improvement with Prozac but states it made him a "zombie". Has stopped medication over the past month. Would like to give attempt to Zoloft as he has family members (wife included) who are on the medication. Denies SI/HI.  Patient endorses intermittent tingling and numbness of upper extremities bilaterally noted on waking up in the morning. Is typically a back sleeper but notes when he wakes up on his stomach, these symptoms are present. Symptoms quickly resolve after getting up. Denies neck injury or history of bulging disc.  Past Medical History:  Diagnosis Date  . Allergy    seasonal  . High cholesterol   . Hypertension   . Sinusitis     Current Outpatient Prescriptions on File Prior to Visit  Medication Sig Dispense Refill  . atorvastatin (LIPITOR) 40 MG tablet Take 0.5 tablets (20 mg total) by mouth every evening. 30 tablet 1  . ibuprofen (ADVIL,MOTRIN) 200 MG tablet Take 200 mg by mouth every 6 (six) hours as needed.    Marland Kitchen lisinopril-hydrochlorothiazide (PRINZIDE,ZESTORETIC) 20-12.5 MG tablet take 1 tablet by mouth twice a day 180 tablet 1  . Multiple Vitamins-Minerals (CENTRUM SILVER ADULT 50+) TABS Take 1 tablet by mouth daily.    Marland Kitchen MOVIPREP 100 G SOLR Take 1 kit (200 g total) by mouth once. moviprep as directed. No substitutions (Patient not taking: Reported on 12/27/2015) 1 kit 0  . [DISCONTINUED] lisinopril (PRINIVIL,ZESTRIL) 20 MG tablet Take 1 tablet (20 mg total) by mouth daily. 30 tablet 0   No current facility-administered medications on file prior to visit.     No Known  Allergies  Family History  Problem Relation Age of Onset  . Hypertension Mother     Living  . Thyroid disease Mother   . Diabetes Father 35    Deceased  . Obesity Father   . Heart attack Father   . Diabetes Other     Paternal Side of Family  . Alzheimer's disease Maternal Grandmother   . Diabetes Sister   . Healthy Son     x3  . Colon cancer Neg Hx   . Rectal cancer Neg Hx   . Stomach cancer Neg Hx     Social History   Social History  . Marital status: Married    Spouse name: N/A  . Number of children: N/A  . Years of education: N/A   Social History Main Topics  . Smoking status: Never Smoker  . Smokeless tobacco: Never Used  . Alcohol use 6.0 oz/week    10 Shots of liquor per week  . Drug use: No  . Sexual activity: Not Asked   Other Topics Concern  . None   Social History Narrative  . None    Review of Systems - See HPI.  All other ROS are negative.  BP 119/78 (BP Location: Right Arm, Patient Position: Sitting, Cuff Size: Large)   Pulse 68   Temp 98.3 F (36.8 C) (Oral)   Resp 16   Ht 6' (1.829 m)   Wt 211 lb 2 oz (95.8 kg)   SpO2 99%   BMI 28.63 kg/m   Physical Exam  Constitutional: He is  oriented to person, place, and time and well-developed, well-nourished, and in no distress.  HENT:  Head: Normocephalic and atraumatic.  Eyes: Conjunctivae are normal.  Neck: Neck supple.  Cardiovascular: Normal rate, regular rhythm, normal heart sounds and intact distal pulses.   Pulmonary/Chest: Effort normal and breath sounds normal. No respiratory distress. He has no wheezes. He has no rales. He exhibits no tenderness.  Neurological: He is alert and oriented to person, place, and time.  Skin: Skin is warm and dry. No rash noted.  Psychiatric: Affect normal.  Vitals reviewed.  Assessment/Plan: Hyperlipidemia Questionable arthralgias 2/2 statin. Would expect more diffuse symptoms. Will hold lipitor for 1 week and see how symptoms respond. If improving,  will switch to another high intensity statin. If no change, will restart the Lipitor and find other cause of symptoms. Dietary and exercise recommendations reviewed with patient.   Anxiety and depression Will begin Sertraline 25 mg daily x 2 weeks, then increase to 50 mg daily. FU scheduled.   Cervical disc disorder with radiculopathy of cervical region Suspect cervical nerve irritation versus bulging disc as cause of symptoms. Will obtain imaging today. Supportive measures and nighttime positioning discussed with patient.     Leeanne Rio, PA-C

## 2015-12-27 NOTE — Assessment & Plan Note (Signed)
Questionable arthralgias 2/2 statin. Would expect more diffuse symptoms. Will hold lipitor for 1 week and see how symptoms respond. If improving, will switch to another high intensity statin. If no change, will restart the Lipitor and find other cause of symptoms. Dietary and exercise recommendations reviewed with patient.

## 2015-12-27 NOTE — Assessment & Plan Note (Signed)
Suspect cervical nerve irritation versus bulging disc as cause of symptoms. Will obtain imaging today. Supportive measures and nighttime positioning discussed with patient.

## 2016-01-20 ENCOUNTER — Telehealth: Payer: Self-pay | Admitting: Physician Assistant

## 2016-01-20 NOTE — Telephone Encounter (Signed)
Relation to [id number] Call back number:[phone]   Reason for call:  Patient checking on the status of Livalo PA, patient states he's currently not taking any cholesterol medication, please advise.

## 2016-01-21 MED ORDER — PITAVASTATIN CALCIUM 2 MG PO TABS: 2.0000 mg | ORAL_TABLET | Freq: Every day | ORAL | 1 refills | Status: DC

## 2016-01-21 MED ORDER — LISINOPRIL-HYDROCHLOROTHIAZIDE 20-12.5 MG PO TABS: 1.0000 | ORAL_TABLET | Freq: Two times a day (BID) | ORAL | 1 refills | Status: DC

## 2016-01-21 NOTE — Telephone Encounter (Signed)
Patient informed, understood & agreed; also sent refill on Lisinopril-HCTZ to local pharmacy at pt request/SLS 10/17

## 2016-01-21 NOTE — Telephone Encounter (Signed)
Please inform patient that I resubmitted the PA personally since we never received a response. Should have an answer within 48 hours max. Will call him once I have the answer.

## 2016-01-21 NOTE — Telephone Encounter (Signed)
Received online approval for the Livalo. Have sent Rx for the 2 mg tablet to pharmacy. He is to pickup and start taking daily as directed. FU 1 month.

## 2016-03-02 ENCOUNTER — Telehealth: Payer: Self-pay | Admitting: Physician Assistant

## 2016-03-02 MED ORDER — FLUOXETINE HCL 10 MG PO CAPS: 10.0000 mg | ORAL_CAPSULE | Freq: Every day | ORAL | 1 refills | Status: DC

## 2016-03-02 NOTE — Telephone Encounter (Signed)
Relation to [id number] Call back number:[phone] Pharmacy: [facility] [address], [place], [place] [address] [phone]     Reason for call:  Patient states sertraline (ZOLOFT) 50 MG tablet is not working requesting to go back to FLUoxetine (PROZAC) 20 MG tablet requesting refill.

## 2016-03-02 NOTE — Telephone Encounter (Signed)
Patient states he felt terrible on Sertraline and wanted to go back on the Prozac. He states he had one episode of the zombie spell but wanted to retry. He is agreeable with the starting the Prozac10 mg.

## 2016-03-02 NOTE — Telephone Encounter (Signed)
Can certainly make the switch but previously he noted the Prozac made him feel like a zombie. We could try to restart the Fluoxetine at a lower dose (10 mg) at first if he would like to see if this helps mood without making him feel the same way as the 20 mg dose. Please let me know what he decides.

## 2016-03-02 NOTE — Telephone Encounter (Signed)
Patient states he will switch to a provider at the [facility] location.

## 2016-03-02 NOTE — Telephone Encounter (Signed)
Ok to send in 30 day supply with 1 refill. FU with me in 1 month.

## 2016-03-04 ENCOUNTER — Telehealth: Payer: Self-pay | Admitting: Physician Assistant

## 2016-03-04 NOTE — Telephone Encounter (Signed)
Pt asking for a referral to see [facility] for neck & arm pain, fax # [phone]

## 2016-03-05 ENCOUNTER — Other Ambulatory Visit: Payer: Self-pay | Admitting: Physician Assistant

## 2016-03-05 DIAGNOSIS — M5010 Cervical disc disorder with radiculopathy, unspecified cervical region: Secondary | ICD-10-CM

## 2016-03-05 NOTE — Telephone Encounter (Signed)
Ok to place referral.

## 2016-03-05 NOTE — Telephone Encounter (Signed)
Ok for the referral? Patient was assessed on 12/27/15. Please advise

## 2016-03-05 NOTE — Telephone Encounter (Signed)
Neurology referral placed. They will contact patient to schedule an appointment.

## 2016-03-11 ENCOUNTER — Encounter: Payer: Self-pay | Admitting: Physician Assistant

## 2016-03-11 ENCOUNTER — Ambulatory Visit (INDEPENDENT_AMBULATORY_CARE_PROVIDER_SITE_OTHER): Payer: 59 | Admitting: Physician Assistant

## 2016-03-11 VITALS — BP 130/86 | HR 74 | Temp 98.4°F | Resp 16 | Ht 72.0 in | Wt 207.0 lb

## 2016-03-11 DIAGNOSIS — M5010 Cervical disc disorder with radiculopathy, unspecified cervical region: Secondary | ICD-10-CM

## 2016-03-11 MED ORDER — METHYLPREDNISOLONE 4 MG PO TBPK: ORAL_TABLET | ORAL | 0 refills | Status: DC

## 2016-03-11 MED ORDER — GABAPENTIN 300 MG PO CAPS: 300.0000 mg | ORAL_CAPSULE | Freq: Every day | ORAL | 3 refills | Status: DC

## 2016-03-11 NOTE — Patient Instructions (Signed)
You will receive a phone call to schedule your MRI. I am hoping to get this done this week.  Please start the Medrol pack and take as directed. Once tolerating well for a couple of days, start the Gabapentin each evening as directed.   Avoid heavy lifting or overexertion. We will likely be sending you to a Neurosurgeon in the near future for further assessment and management but we need the MRI first.   If symptoms acutely worsen, please go to the ER.

## 2016-03-11 NOTE — Progress Notes (Signed)
Pre visit review using our clinic review tool, if applicable. No additional management support is needed unless otherwise documented below in the visit note. 

## 2016-03-11 NOTE — Progress Notes (Signed)
Patient presents to clinic today c/o worsening tingling of upper extremities bilaterally over the past few weeks, now with associated intermittent numbness. Denies neck pain. Does note mild weakness of extremity. Patient denies trauma or injury. Patient was seen in 12/2015 for tingling in upper extremities only with laying in certain positions for prolonged durations. X-ray cervical spine obtained at that time revealed arthritic changes and disc space narrowing at C5-C6. Patient endorses seeing a chiropractor twice with not improvement after adjustments.  Past Medical History:  Diagnosis Date  . Allergy    seasonal  . High cholesterol   . Hypertension   . Sinusitis     Current Outpatient Prescriptions on File Prior to Visit  Medication Sig Dispense Refill  . FLUoxetine (PROZAC) 10 MG capsule Take 1 capsule (10 mg total) by mouth daily. 30 capsule 1  . ibuprofen (ADVIL,MOTRIN) 200 MG tablet Take 200 mg by mouth every 6 (six) hours as needed.    Marland Kitchen lisinopril-hydrochlorothiazide (PRINZIDE,ZESTORETIC) 20-12.5 MG tablet Take 1 tablet by mouth 2 (two) times daily. 180 tablet 1  . Multiple Vitamins-Minerals (CENTRUM SILVER ADULT 50+) TABS Take 1 tablet by mouth daily.    . Pitavastatin Calcium (LIVALO) 2 MG TABS Take 1 tablet (2 mg total) by mouth daily. (Patient not taking: Reported on 03/11/2016) 90 tablet 1  . [DISCONTINUED] lisinopril (PRINIVIL,ZESTRIL) 20 MG tablet Take 1 tablet (20 mg total) by mouth daily. 30 tablet 0   No current facility-administered medications on file prior to visit.     No Known Allergies  Family History  Problem Relation Age of Onset  . Hypertension Mother     Living  . Thyroid disease Mother   . Diabetes Father 71    Deceased  . Obesity Father   . Heart attack Father   . Diabetes Other     Paternal Side of Family  . Alzheimer's disease Maternal Grandmother   . Diabetes Sister   . Healthy Son     x3  . Colon cancer Neg Hx   . Rectal cancer Neg Hx   .  Stomach cancer Neg Hx     Social History   Social History  . Marital status: Married    Spouse name: N/A  . Number of children: N/A  . Years of education: N/A   Social History Main Topics  . Smoking status: Never Smoker  . Smokeless tobacco: Never Used  . Alcohol use 6.0 oz/week    10 Shots of liquor per week  . Drug use: No  . Sexual activity: Not Asked   Other Topics Concern  . None   Social History Narrative  . None   Review of Systems - See HPI.  All other ROS are negative.  BP 130/86   Pulse 74   Temp 98.4 F (36.9 C) (Oral)   Resp 16   Ht 6' (1.829 m)   Wt 207 lb (93.9 kg)   SpO2 98%   BMI 28.07 kg/m   Physical Exam  Constitutional: He is oriented to person, place, and time and well-developed, well-nourished, and in no distress.  HENT:  Head: Normocephalic and atraumatic.  Eyes: Conjunctivae are normal.  Neck: Neck supple.  Cardiovascular: Normal rate, regular rhythm, normal heart sounds and intact distal pulses.   Pulmonary/Chest: Effort normal and breath sounds normal. No respiratory distress. He has no wheezes. He has no rales. He exhibits no tenderness.  Musculoskeletal:       Cervical back: He exhibits normal range of  motion, no tenderness and no bony tenderness.  Neurological: He is alert and oriented to person, place, and time. He has normal sensation, normal strength and normal reflexes.  Reflex Scores:      Tricep reflexes are 2+ on the right side and 2+ on the left side.      Bicep reflexes are 2+ on the right side and 2+ on the left side. Skin: Skin is warm and dry. No rash noted.  Psychiatric: Affect normal.  Vitals reviewed.  Recent Results (from the past 2160 hour(s))  Lipid Profile     Status: Abnormal   Collection Time: 12/27/15  8:02 AM  Result Value Ref Range   Cholesterol 199 0 - 200 mg/dL    Comment: ATP III Classification       Desirable:  < 200 mg/dL               Borderline High:  200 - 239 mg/dL          High:  > = 240 mg/dL     Triglycerides 238.0 (H) 0.0 - 149.0 mg/dL    Comment: Normal:  <150 mg/dLBorderline High:  150 - 199 mg/dL   HDL 39.70 >39.00 mg/dL   VLDL 47.6 (H) 0.0 - 40.0 mg/dL   Total CHOL/HDL Ratio 5     Comment:                Men          Women1/2 Average Risk     3.4          3.3Average Risk          5.0          4.42X Average Risk          9.6          7.13X Average Risk          15.0          11.0                       NonHDL 158.89     Comment: NOTE:  Non-HDL goal should be 30 mg/dL higher than patient's LDL goal (i.e. LDL goal of < 70 mg/dL, would have non-HDL goal of < 100 mg/dL)  Hepatic function panel     Status: Abnormal   Collection Time: 12/27/15  8:02 AM  Result Value Ref Range   Total Bilirubin 0.4 0.2 - 1.2 mg/dL   Bilirubin, Direct 0.0 0.0 - 0.3 mg/dL   Alkaline Phosphatase 75 39 - 117 U/L   AST 40 (H) 0 - 37 U/L   ALT 42 0 - 53 U/L   Total Protein 7.1 6.0 - 8.3 g/dL   Albumin 4.2 3.5 - 5.2 g/dL  LDL cholesterol, direct     Status: None   Collection Time: 12/27/15  8:02 AM  Result Value Ref Range   Direct LDL 135.0 mg/dL    Comment: Optimal:  <100 mg/dLNear or Above Optimal:  100-129 mg/dLBorderline High:  130-159 mg/dLHigh:  160-189 mg/dLVery High:  >190 mg/dL   Assessment/Plan: 1. Cervical disc disorder with radiculopathy of cervical region MRI cervical spine to further assess. Medrol pack started. Will also start Gabapentin 300 mg in the evening. FU scheduled. Alarm signs/symptoms discussed that would prompt urgent ER assessment.   - MR Cervical Spine Wo Contrast; Future - methylPREDNISolone (MEDROL DOSEPAK) 4 MG TBPK tablet; Take following package directions.  Dispense: 21 tablet; Refill: 0 -  gabapentin (NEURONTIN) 300 MG capsule; Take 1 capsule (300 mg total) by mouth at bedtime.  Dispense: 30 capsule; Refill: 3   Leeanne Rio, [place]

## 2016-03-12 ENCOUNTER — Telehealth: Payer: Self-pay | Admitting: Physician Assistant

## 2016-03-12 NOTE — Telephone Encounter (Signed)
He should be ok to take them together if he is tolerating Prozac well. If he starts the prednisone and notes any side effect, it is most likely solely from the prednisone. He is to stop and give [place] a call.

## 2016-03-12 NOTE — Telephone Encounter (Signed)
Pt LM on VM stating that he was given methylPREDNISolone yesterday for tingling and states that he just started back on prozac 4 days ago, pt asking is this ok to take together or should he stop the prozac.

## 2016-03-12 NOTE — Telephone Encounter (Signed)
Is there an interaction with both medications?

## 2016-03-12 NOTE — Telephone Encounter (Signed)
Informed patient of Cody's advice.  He stated verbal understanding and will call us with any issues.      He did state that he was supposed to let us know back if he had not heard back about an MRI, he stated that he has not heard from them and would like to know if there is anything that needs to be done.

## 2016-03-13 NOTE — Telephone Encounter (Signed)
Insurance is pending authorization

## 2016-03-13 NOTE — Telephone Encounter (Signed)
Can take a couple of days to get approved by insurance. I will send a message to our referral coordinators to check on this and call patient.   Delsa Sale will you take a look? Thank you.

## 2016-03-17 ENCOUNTER — Other Ambulatory Visit: Payer: Self-pay | Admitting: Physician Assistant

## 2016-03-17 DIAGNOSIS — M5010 Cervical disc disorder with radiculopathy, unspecified cervical region: Secondary | ICD-10-CM

## 2016-03-18 ENCOUNTER — Ambulatory Visit: Payer: Self-pay | Admitting: Physician Assistant

## 2016-05-08 ENCOUNTER — Other Ambulatory Visit: Payer: Self-pay | Admitting: Physician Assistant

## 2016-05-10 NOTE — Telephone Encounter (Signed)
Refills of Prozac granted. Needs FU with me in March. Is he taking his Livalo for cholesterol? Would like to repeat lipids and liver function on the Livalo at his follow-up.  Please call patient.

## 2016-05-11 ENCOUNTER — Telehealth: Payer: Self-pay | Admitting: Physician Assistant

## 2016-05-11 NOTE — Telephone Encounter (Signed)
Caller with [employer] states that a prior auth is needed before Rx livalo can be filled. Fax # [phone]

## 2016-05-12 ENCOUNTER — Encounter: Payer: Self-pay | Admitting: Emergency Medicine

## 2016-05-14 ENCOUNTER — Telehealth: Payer: Self-pay | Admitting: Emergency Medicine

## 2016-05-14 DIAGNOSIS — E785 Hyperlipidemia, unspecified: Secondary | ICD-10-CM

## 2016-05-14 NOTE — Telephone Encounter (Signed)
LMOVM advising patient we had to start a PA for the Livalo, if patient is currently taking medication and tolerating well. Please schedule an f/u appt to recheck lipid panel levels

## 2016-05-14 NOTE — Telephone Encounter (Signed)
Called [employer] for [place]. The Livalo is needing a new Pa. Initiated the PA over the phone.

## 2016-05-14 NOTE — Telephone Encounter (Signed)
FYI

## 2016-05-14 NOTE — Telephone Encounter (Signed)
-----   Message from Francine Graven sent at 05/14/2016 11:11 AM EST ----- Contact: William Shea William Shea states that he is tolerating Livalo and does need to get the prior authorization. William Shea scheduled follow up cholesterol for 03/14 at 8:45.

## 2016-05-15 NOTE — Telephone Encounter (Signed)
Denial for PA for Livalo. Patient has to fail Pravastatin, Lovastatin and Fluvastatin.  Please advise

## 2016-05-15 NOTE — Telephone Encounter (Signed)
PA was denied due to patient needing to fail Pravastatin, Lovastatin or Fluvastatin.

## 2016-05-15 NOTE — Telephone Encounter (Signed)
Please make sure to complete PA -- continuation of medication Has failed other statins before

## 2016-05-18 NOTE — Telephone Encounter (Signed)
He needs a moderate intensity statin -- I know he could not tolerate Lipitor. Has he ever been on crestor (rosuvastatin)?

## 2016-05-19 NOTE — Telephone Encounter (Signed)
Can try Crestor (rosuvastatin) 10 mg daily. Ok to send in 30 day with 1 refill. Needs fasting lipid and LFTs in 6 weeks.

## 2016-05-19 NOTE — Telephone Encounter (Signed)
Spoke with patient and advised the PA was denied for the Livalo. He states he had joint pains with the Atorvastatin. He denies being on Crestor in the past.

## 2016-05-20 MED ORDER — ROSUVASTATIN CALCIUM 10 MG PO TABS: 10.0000 mg | ORAL_TABLET | Freq: Every day | ORAL | 1 refills | Status: DC

## 2016-05-20 NOTE — Addendum Note (Signed)
Addended by: Leonidas Romberg on: 05/20/2016 10:29 AM   Modules accepted: Orders

## 2016-06-17 ENCOUNTER — Encounter: Payer: Self-pay | Admitting: Physician Assistant

## 2016-06-17 ENCOUNTER — Ambulatory Visit (INDEPENDENT_AMBULATORY_CARE_PROVIDER_SITE_OTHER): Payer: 59 | Admitting: Physician Assistant

## 2016-06-17 VITALS — BP 118/80 | HR 72 | Temp 97.9°F | Resp 14 | Ht 72.0 in | Wt 212.0 lb

## 2016-06-17 DIAGNOSIS — R635 Abnormal weight gain: Secondary | ICD-10-CM

## 2016-06-17 DIAGNOSIS — I10 Essential (primary) hypertension: Secondary | ICD-10-CM | POA: Diagnosis not present

## 2016-06-17 DIAGNOSIS — E785 Hyperlipidemia, unspecified: Secondary | ICD-10-CM | POA: Diagnosis not present

## 2016-06-17 DIAGNOSIS — Z1211 Encounter for screening for malignant neoplasm of colon: Secondary | ICD-10-CM

## 2016-06-17 HISTORY — DX: Abnormal weight gain: R63.5

## 2016-06-17 LAB — LIPID PANEL
CHOLESTEROL: 282 mg/dL — AB (ref 0–200)
HDL: 46.3 mg/dL (ref >39.00)
NonHDL: 235.96
TRIGLYCERIDES: 310 mg/dL — AB (ref 0.0–149.0)
Total CHOL/HDL Ratio: 6
VLDL: 62 mg/dL — AB (ref 0.0–40.0)

## 2016-06-17 LAB — LDL CHOLESTEROL, DIRECT: LDL DIRECT: 173 mg/dL

## 2016-06-17 LAB — COMPREHENSIVE METABOLIC PANEL
ALBUMIN: 4.5 g/dL (ref 3.5–5.2)
ALK PHOS: 76 U/L (ref 39–117)
ALT: 47 U/L (ref 0–53)
AST: 48 U/L — ABNORMAL HIGH (ref 0–37)
BUN: 17 mg/dL (ref 6–23)
CHLORIDE: 98 meq/L (ref 96–112)
CO2: 30 mEq/L (ref 19–32)
CREATININE: 0.86 mg/dL (ref 0.40–1.50)
Calcium: 10 mg/dL (ref 8.4–10.5)
GFR: 98.98 mL/min (ref >60.00)
Glucose, Bld: 118 mg/dL — ABNORMAL HIGH (ref 70–99)
Potassium: 5.3 mEq/L — ABNORMAL HIGH (ref 3.5–5.1)
SODIUM: 136 meq/L (ref 135–145)
TOTAL PROTEIN: 7.1 g/dL (ref 6.0–8.3)
Total Bilirubin: 0.4 mg/dL (ref 0.2–1.2)

## 2016-06-17 LAB — TSH: TSH: 1.57 u[IU]/mL (ref 0.35–4.50)

## 2016-06-17 NOTE — Patient Instructions (Signed)
Please go to the lab for blood work. I will call you with your results.  Please start the Crestor now that you have completed the Livalo. Follow-up to the lab in 6 weeks for repeat liver testing and cholesterol panel.   Please continue BP medications as directed.   Cholesterol Cholesterol is a fat. Your body needs a small amount of cholesterol. Cholesterol (plaque) may build up in your blood vessels (arteries). That makes you more likely to have a heart attack or stroke. You cannot feel your cholesterol level. Having a blood test is the only way to find out if your level is high. Keep your test results. Work with your doctor to keep your cholesterol at a good level. What do the results mean?  Total cholesterol is how much cholesterol is in your blood.  LDL is bad cholesterol. This is the type that can build up. Try to have low LDL.  HDL is good cholesterol. It cleans your blood vessels and carries LDL away. Try to have high HDL.  Triglycerides are fat that the body can store or burn for energy. What are good levels of cholesterol?  Total cholesterol below 200.  LDL below 100 is good for people who have health risks. LDL below 70 is good for people who have very high risks.  HDL above 40 is good. It is best to have HDL of 60 or higher.  Triglycerides below 150. How can I lower my cholesterol? Diet  Follow your diet program as told by your doctor.  Choose fish, white meat chicken, or [place] that is roasted or baked. Try not to eat red meat, fried foods, sausage, or lunch meats.  Eat lots of fresh fruits and vegetables.  Choose whole grains, beans, pasta, potatoes, and cereals.  Choose olive oil, corn oil, or canola oil. Only use small amounts.  Try not to eat butter, mayonnaise, shortening, or palm kernel oils.  Try not to eat foods with trans fats.  Choose low-fat or nonfat dairy foods.  Drink skim or nonfat milk.  Eat low-fat or nonfat yogurt and cheeses.  Try not  to drink whole milk or cream.  Try not to eat ice cream, egg yolks, or full-fat cheeses.  Healthy desserts include angel food cake, ginger snaps, animal crackers, hard candy, popsicles, and low-fat or nonfat frozen yogurt. Try not to eat pastries, cakes, pies, and cookies. Exercise  Follow your exercise program as told by your doctor.  Be more active. Try gardening, walking, and taking the stairs.  Ask your doctor about ways that you can be more active. Medicine  Take over-the-counter and prescription medicines only as told by your doctor. This information is not intended to replace advice given to you by your health care provider. Make sure you discuss any questions you have with your health care provider. Document Released: 06/19/2008 Document Revised: 10/23/2015 Document Reviewed: 10/03/2015 Elsevier Interactive Patient Education  2017 [employer].

## 2016-06-17 NOTE — Progress Notes (Signed)
Pre visit review using our clinic review tool, if applicable. No additional management support is needed unless otherwise documented below in the visit note. 

## 2016-06-17 NOTE — Progress Notes (Signed)
Patient presents to clinic today for follow-up of hypertension and hyperlipidemia. Patient is currently on regimen of lisinopril-HCTZ 20-12.5 mg daily and Crestor 10 mg daily. Is taking  BP medication as directed. Has not started the Crestor as he is finishing up the last of his Livalo (switched from Livalo to Crestor 2/2 cost). Exercise has been limited 2/2 knee surgeries. Is staying very active at work. Diet is well-balanced overall -- states he eats well during the week but orders out on the weekends. Body mass index is 28.75 kg/m.    BP Readings from Last 3 Encounters:  06/17/16 118/80  03/11/16 130/86  12/27/15 119/78   Pt reports he has had an increased appetite over the past 5-6 weeks in the afternoon. States he is having to have an extra snack in the afternoon time, which is unusual for him. Reports mom has a history of thyroid disease (unsure of what specifically) and he is concerned this change in appetite could be due to thyroid disease. States he has recently taken 2 prednisone packs for knee pain, finished course around 5-6 weeks ago (around time that increased appetite began). Reports he has gained approximately 10-12 lbs. Does not currently exercise, states he had knee surgery in November 2017. Stays active at work. Endorses mostly healthy diet. Denies constipation, diarrhea, hot/cold insensitivity, fatigue, depression, anxiety, palpitations, chest pain.    Past Medical History:  Diagnosis Date  . Allergy    seasonal  . High cholesterol   . Hypertension   . Sinusitis     Current Outpatient Prescriptions on File Prior to Visit  Medication Sig Dispense Refill  . FLUoxetine (PROZAC) 10 MG capsule take 1 capsule by mouth once daily 30 capsule 1  . gabapentin (NEURONTIN) 300 MG capsule Take 1 capsule (300 mg total) by mouth at bedtime. 30 capsule 3  . ibuprofen (ADVIL,MOTRIN) 200 MG tablet Take 200 mg by mouth every 6 (six) hours as needed.    Marland Kitchen lisinopril-hydrochlorothiazide  (PRINZIDE,ZESTORETIC) 20-12.5 MG tablet Take 1 tablet by mouth 2 (two) times daily. 180 tablet 1  . Multiple Vitamins-Minerals (CENTRUM SILVER ADULT 50+) TABS Take 1 tablet by mouth daily.    . rosuvastatin (CRESTOR) 10 MG tablet Take 1 tablet (10 mg total) by mouth daily. (Patient not taking: Reported on 06/17/2016) 30 tablet 1  . [DISCONTINUED] lisinopril (PRINIVIL,ZESTRIL) 20 MG tablet Take 1 tablet (20 mg total) by mouth daily. 30 tablet 0   No current facility-administered medications on file prior to visit.    No Known Allergies  Family History  Problem Relation Age of Onset  . Hypertension Mother     Living  . Thyroid disease Mother   . Diabetes Father 65    Deceased  . Obesity Father   . Heart attack Father   . Diabetes Other     Paternal Side of Family  . Alzheimer's disease Maternal Grandmother   . Diabetes Sister   . Healthy Son     x3  . Colon cancer Neg Hx   . Rectal cancer Neg Hx   . Stomach cancer Neg Hx     Social History   Social History  . Marital status: Married    Spouse name: N/A  . Number of children: N/A  . Years of education: N/A   Social History Main Topics  . Smoking status: Never Smoker  . Smokeless tobacco: Never Used  . Alcohol use 6.0 oz/week    10 Shots of liquor per week  .  Drug use: No  . Sexual activity: Not Asked   Other Topics Concern  . None   Social History Narrative  . None   Review of Systems - See HPI.  All other ROS are negative.  BP 118/80   Pulse 72   Temp 97.9 F (36.6 C) (Oral)   Resp 14   Ht 6' (1.829 m)   Wt 212 lb (96.2 kg)   SpO2 97%   BMI 28.75 kg/m   Physical Exam  Constitutional: He is oriented to person, place, and time and well-developed, well-nourished, and in no distress.  HENT:  Head: Normocephalic and atraumatic.  Mouth/Throat: Oropharynx is clear and moist. No oropharyngeal exudate.  Eyes: Conjunctivae are normal.  Neck: Neck supple. No thyroid mass and no thyromegaly present.    Cardiovascular: Normal rate, regular rhythm, normal heart sounds and intact distal pulses.  Exam reveals no gallop and no friction rub.   No murmur heard. Pulmonary/Chest: Effort normal and breath sounds normal. No respiratory distress. He has no wheezes. He has no rales. He exhibits no tenderness.  Lymphadenopathy:    He has no cervical adenopathy.  Neurological: He is alert and oriented to person, place, and time.  Reflex Scores:      Patellar reflexes are 2+ on the right side and 2+ on the left side. Skin: Skin is warm and dry. No rash noted.  Psychiatric: Affect normal.  Vitals reviewed.  Assessment/Plan: 1. Colon cancer screening New referral placed to colonoscopy for screening. - Ambulatory referral to Gastroenterology  2. Essential hypertension BP stable. Will check labs today. Continue current medication regimen.  3. Hyperlipidemia, unspecified hyperlipidemia type Taking Livalo. Will start Crestor now that Livalo is out. Repeat labs today. Will also need another LFT in 6 weeks after starting Livalo. - Lipid panel - Comp Met (CMET)  4. Weight gain Likely secondary to steroid use and increase snacking. Dietary and exercise recommendations reviewed with patient. Will check TSH today.  - TSH   Leeanne Rio, PA-C

## 2016-06-23 ENCOUNTER — Other Ambulatory Visit: Payer: Self-pay | Admitting: Physician Assistant

## 2016-06-23 DIAGNOSIS — R945 Abnormal results of liver function studies: Secondary | ICD-10-CM

## 2016-06-23 DIAGNOSIS — R7989 Other specified abnormal findings of blood chemistry: Secondary | ICD-10-CM

## 2016-06-23 DIAGNOSIS — E875 Hyperkalemia: Secondary | ICD-10-CM

## 2016-07-16 ENCOUNTER — Other Ambulatory Visit: Payer: Self-pay | Admitting: Physician Assistant

## 2016-07-16 DIAGNOSIS — M5010 Cervical disc disorder with radiculopathy, unspecified cervical region: Secondary | ICD-10-CM

## 2016-07-17 ENCOUNTER — Telehealth: Payer: Self-pay | Admitting: Physician Assistant

## 2016-07-17 DIAGNOSIS — M5010 Cervical disc disorder with radiculopathy, unspecified cervical region: Secondary | ICD-10-CM

## 2016-07-17 MED ORDER — GABAPENTIN 300 MG PO CAPS: 300.0000 mg | ORAL_CAPSULE | Freq: Every day | ORAL | 3 refills | Status: DC

## 2016-07-17 MED ORDER — FLUOXETINE HCL 10 MG PO CAPS: 10.0000 mg | ORAL_CAPSULE | Freq: Every day | ORAL | 3 refills | Status: DC

## 2016-07-17 NOTE — Telephone Encounter (Signed)
Pt LMOVM asking for refills on gabapentin & fluoxetine be sent to [facility] [address] in [place]

## 2016-07-17 NOTE — Telephone Encounter (Signed)
I have sent in refills. Please let patient know.

## 2016-07-17 NOTE — Telephone Encounter (Signed)
LMOVM advise pt that Rx were called in for him.

## 2016-07-20 ENCOUNTER — Encounter: Payer: Self-pay | Admitting: Physician Assistant

## 2016-08-08 ENCOUNTER — Other Ambulatory Visit: Payer: Self-pay | Admitting: Physician Assistant

## 2016-10-17 DIAGNOSIS — M1710 Unilateral primary osteoarthritis, unspecified knee: Secondary | ICD-10-CM

## 2016-10-17 HISTORY — DX: Unilateral primary osteoarthritis, unspecified knee: M17.10

## 2016-12-01 ENCOUNTER — Telehealth: Payer: Self-pay | Admitting: Emergency Medicine

## 2016-12-01 NOTE — Telephone Encounter (Signed)
[place] advising patient to contact [facility] to schedule an appointment for his Colonoscopy. Referral placed on 06/17/16. They have called and left messages and unable to get patient scheduled.

## 2017-01-08 ENCOUNTER — Ambulatory Visit: Payer: Self-pay | Admitting: Physician Assistant

## 2017-02-19 ENCOUNTER — Ambulatory Visit (INDEPENDENT_AMBULATORY_CARE_PROVIDER_SITE_OTHER): Payer: 59 | Admitting: Physician Assistant

## 2017-02-19 ENCOUNTER — Encounter: Payer: Self-pay | Admitting: Gastroenterology

## 2017-02-19 ENCOUNTER — Encounter: Payer: Self-pay | Admitting: Physician Assistant

## 2017-02-19 ENCOUNTER — Other Ambulatory Visit: Payer: Self-pay

## 2017-02-19 VITALS — BP 120/80 | HR 62 | Temp 98.1°F | Resp 14 | Ht 72.0 in | Wt 206.0 lb

## 2017-02-19 DIAGNOSIS — F32A Depression, unspecified: Secondary | ICD-10-CM

## 2017-02-19 DIAGNOSIS — E785 Hyperlipidemia, unspecified: Secondary | ICD-10-CM | POA: Diagnosis not present

## 2017-02-19 DIAGNOSIS — Z125 Encounter for screening for malignant neoplasm of prostate: Secondary | ICD-10-CM

## 2017-02-19 DIAGNOSIS — I10 Essential (primary) hypertension: Secondary | ICD-10-CM | POA: Diagnosis not present

## 2017-02-19 DIAGNOSIS — Z23 Encounter for immunization: Secondary | ICD-10-CM

## 2017-02-19 DIAGNOSIS — F329 Major depressive disorder, single episode, unspecified: Secondary | ICD-10-CM

## 2017-02-19 DIAGNOSIS — F419 Anxiety disorder, unspecified: Secondary | ICD-10-CM | POA: Diagnosis not present

## 2017-02-19 DIAGNOSIS — R21 Rash and other nonspecific skin eruption: Secondary | ICD-10-CM | POA: Insufficient documentation

## 2017-02-19 DIAGNOSIS — Z0000 Encounter for general adult medical examination without abnormal findings: Secondary | ICD-10-CM | POA: Diagnosis not present

## 2017-02-19 DIAGNOSIS — Z1211 Encounter for screening for malignant neoplasm of colon: Secondary | ICD-10-CM

## 2017-02-19 HISTORY — DX: Rash and other nonspecific skin eruption: R21

## 2017-02-19 HISTORY — DX: Encounter for screening for malignant neoplasm of prostate: Z12.5

## 2017-02-19 LAB — CBC WITH DIFFERENTIAL/PLATELET
BASOS ABS: 0.1 10*3/uL (ref 0.0–0.1)
Basophils Relative: 1.2 % (ref 0.0–3.0)
Eosinophils Absolute: 0.2 10*3/uL (ref 0.0–0.7)
Eosinophils Relative: 3.2 % (ref 0.0–5.0)
HCT: 39.1 % (ref 39.0–52.0)
Hemoglobin: 12.5 g/dL — ABNORMAL LOW (ref 13.0–17.0)
LYMPHS ABS: 1.4 10*3/uL (ref 0.7–4.0)
Lymphocytes Relative: 21.4 % (ref 12.0–46.0)
MCHC: 32 g/dL (ref 30.0–36.0)
MCV: 83.6 fl (ref 78.0–100.0)
MONOS PCT: 8 % (ref 3.0–12.0)
Monocytes Absolute: 0.5 10*3/uL (ref 0.1–1.0)
NEUTROS ABS: 4.3 10*3/uL (ref 1.4–7.7)
NEUTROS PCT: 66.2 % (ref 43.0–77.0)
PLATELETS: 447 10*3/uL — AB (ref 150.0–400.0)
RBC: 4.67 Mil/uL (ref 4.22–5.81)
RDW: 16.5 % — ABNORMAL HIGH (ref 11.5–15.5)
WBC: 6.4 10*3/uL (ref 4.0–10.5)

## 2017-02-19 LAB — LIPID PANEL
CHOL/HDL RATIO: 7
CHOLESTEROL: 290 mg/dL — AB (ref 0–200)
HDL: 40.9 mg/dL (ref >39.00)
NONHDL: 249.1
TRIGLYCERIDES: 298 mg/dL — AB (ref 0.0–149.0)
VLDL: 59.6 mg/dL — AB (ref 0.0–40.0)

## 2017-02-19 LAB — COMPREHENSIVE METABOLIC PANEL
ALBUMIN: 4.6 g/dL (ref 3.5–5.2)
ALT: 27 U/L (ref 0–53)
AST: 29 U/L (ref 0–37)
Alkaline Phosphatase: 82 U/L (ref 39–117)
BUN: 14 mg/dL (ref 6–23)
CHLORIDE: 98 meq/L (ref 96–112)
CO2: 30 meq/L (ref 19–32)
CREATININE: 0.78 mg/dL (ref 0.40–1.50)
Calcium: 9.8 mg/dL (ref 8.4–10.5)
GFR: 110.5 mL/min (ref >60.00)
Glucose, Bld: 125 mg/dL — ABNORMAL HIGH (ref 70–99)
POTASSIUM: 4.5 meq/L (ref 3.5–5.1)
SODIUM: 137 meq/L (ref 135–145)
Total Bilirubin: 0.4 mg/dL (ref 0.2–1.2)
Total Protein: 7.3 g/dL (ref 6.0–8.3)

## 2017-02-19 LAB — HEMOGLOBIN A1C: Hgb A1c MFr Bld: 6.3 % (ref 4.6–6.5)

## 2017-02-19 LAB — LDL CHOLESTEROL, DIRECT: Direct LDL: 190 mg/dL

## 2017-02-19 LAB — PSA: PSA: 0.6 ng/mL (ref 0.10–4.00)

## 2017-02-19 MED ORDER — CLOTRIMAZOLE-BETAMETHASONE 1-0.05 % EX CREA: 1.0000 "application " | TOPICAL_CREAM | Freq: Two times a day (BID) | CUTANEOUS | 0 refills | Status: DC

## 2017-02-19 NOTE — Assessment & Plan Note (Signed)
Repeat lipids and LFT today. Dietary and exercise recommendations given.

## 2017-02-19 NOTE — Patient Instructions (Signed)
Please go to the lab for blood work.   Our office will call you with your results unless you have chosen to receive results via MyChart.  If your blood work is normal we will follow-up each year for physicals and as scheduled for chronic medical problems.  If anything is abnormal we will treat accordingly and get you in for a follow-up.  You will be contacted by Gastroenterology for a screening colonoscopy. It is very important that you have this done.  Please use the cream given, applying to the rash as directed for 2 weeks. If not resolving, please come see me for reassessment.  Cut back on Advil use. Maybe consider 400 mg max and see how you do. I also recommend adding on a turmeric supplement (500 mg daily) as this is great for inflammation.   Preventive Care 40-64 Years, Male Preventive care refers to lifestyle choices and visits with your health care provider that can promote health and wellness. What does preventive care include?  A yearly physical exam. This is also called an annual well check.  Dental exams once or twice a year.  Routine eye exams. Ask your health care provider how often you should have your eyes checked.  Personal lifestyle choices, including: ? Daily care of your teeth and gums. ? Regular physical activity. ? Eating a healthy diet. ? Avoiding tobacco and drug use. ? Limiting alcohol use. ? Practicing safe sex. ? Taking low-dose aspirin every day starting at age 47. What happens during an annual well check? The services and screenings done by your health care provider during your annual well check will depend on your age, overall health, lifestyle risk factors, and family history of disease. Counseling Your health care provider may ask you questions about your:  Alcohol use.  Tobacco use.  Drug use.  Emotional well-being.  Home and relationship well-being.  Sexual activity.  Eating habits.  Work and work Statistician.  Screening You  may have the following tests or measurements:  Height, weight, and BMI.  Blood pressure.  Lipid and cholesterol levels. These may be checked every 5 years, or more frequently if you are over 67 years old.  Skin check.  Lung cancer screening. You may have this screening every year starting at age 32 if you have a 30-pack-year history of smoking and currently smoke or have quit within the past 15 years.  Fecal occult blood test (FOBT) of the stool. You may have this test every year starting at age 73.  Flexible sigmoidoscopy or colonoscopy. You may have a sigmoidoscopy every 5 years or a colonoscopy every 10 years starting at age 8.  Prostate cancer screening. Recommendations will vary depending on your family history and other risks.  Hepatitis C blood test.  Hepatitis B blood test.  Sexually transmitted disease (STD) testing.  Diabetes screening. This is done by checking your blood sugar (glucose) after you have not eaten for a while (fasting). You may have this done every 1-3 years.  Discuss your test results, treatment options, and if necessary, the need for more tests with your health care provider. Vaccines Your health care provider may recommend certain vaccines, such as:  Influenza vaccine. This is recommended every year.  Tetanus, diphtheria, and acellular pertussis (Tdap, Td) vaccine. You may need a Td booster every 10 years.  Varicella vaccine. You may need this if you have not been vaccinated.  Zoster vaccine. You may need this after age 22.  Measles, mumps, and rubella (MMR) vaccine.  You may need at least one dose of MMR if you were born in 1957 or later. You may also need a second dose.  Pneumococcal 13-valent conjugate (PCV13) vaccine. You may need this if you have certain conditions and have not been vaccinated.  Pneumococcal polysaccharide (PPSV23) vaccine. You may need one or two doses if you smoke cigarettes or if you have certain  conditions.  Meningococcal vaccine. You may need this if you have certain conditions.  Hepatitis A vaccine. You may need this if you have certain conditions or if you travel or work in places where you may be exposed to hepatitis A.  Hepatitis B vaccine. You may need this if you have certain conditions or if you travel or work in places where you may be exposed to hepatitis B.  Haemophilus influenzae type b (Hib) vaccine. You may need this if you have certain risk factors.  Talk to your health care provider about which screenings and vaccines you need and how often you need them. This information is not intended to replace advice given to you by your health care provider. Make sure you discuss any questions you have with your health care provider. Document Released: 04/19/2015 Document Revised: 12/11/2015 Document Reviewed: 01/22/2015 Elsevier Interactive Patient Education  2017 [employer].

## 2017-02-19 NOTE — Assessment & Plan Note (Signed)
Referral for screening colonoscopy placed

## 2017-02-19 NOTE — Assessment & Plan Note (Signed)
BP well-controlled. Asymptomatic. Continue current regimen. CMP today.

## 2017-02-19 NOTE — Assessment & Plan Note (Signed)
The natural history of prostate cancer and ongoing controversy regarding screening and potential treatment outcomes of prostate cancer has been discussed with the patient. The meaning of a false positive PSA and a false negative PSA has been discussed. He indicates understanding of the limitations of this screening test and wishes  to proceed with screening PSA testing.  

## 2017-02-19 NOTE — Assessment & Plan Note (Signed)
Most likely yeast/fungal. Start trial of clotriamzole-betamethasone. Foot hygiene discussed. Follow-up if not resolving.

## 2017-02-19 NOTE — Assessment & Plan Note (Signed)
Doing well off of medication.  Will monitor.

## 2017-02-19 NOTE — Assessment & Plan Note (Signed)
Depression screen negative. Health Maintenance reviewed -- Flu shot updated. Needs colonoscopy. Referral placed. Preventive schedule discussed and handout given in AVS. Will obtain fasting labs today.

## 2017-02-19 NOTE — Progress Notes (Signed)
Patient presents to clinic today for annual exam.  Patient is fasting for labs. Diet -- Well-balanced. Exercise -- No regular regimen but stays very active at work. Body mass index is 27.94 kg/m.  Acute Concerns: Patient endorses rash of left foot/ankle that has been present for 7+ months. Is occasionally pruritic but not painful. Has not changed size. Patient denies ill-fitting footwear.  Chronic Issues: Hypertension -- Is currently on a regimen of lisinopril-HCTZ 20-12.5 mg. Is taking daily as directed. Patient denies chest pain, palpitations, lightheadedness, dizziness, vision changes or frequent headaches.  BP Readings from Last 3 Encounters:  02/19/17 120/80  06/17/16 118/80  03/11/16 130/86   Hyperlipidemia -- is not taking medication as directed. Is working on diet and staying active.   Anxiety/Depression -- Doing very well overall. Is not taking his Fluoxetine as he felt it caused fluctuations in his mood. Is working on Government social research officer.   Health Maintenance: Immunizations -- Tetanus up-to-date. Agrees to flu vaccination today. Colonoscopy -- Overdue. Asymptomatic. Is agreeable to screening colonoscopy.  Past Medical History:  Diagnosis Date  . Allergy    seasonal  . High cholesterol   . Hypertension   . Sinusitis     Past Surgical History:  Procedure Laterality Date  . Unremarkable      Current Outpatient Medications on File Prior to Visit  Medication Sig Dispense Refill  . ibuprofen (ADVIL,MOTRIN) 200 MG tablet Take 200 mg by mouth every 6 (six) hours as needed.    Marland Kitchen lisinopril-hydrochlorothiazide (PRINZIDE,ZESTORETIC) 20-12.5 MG tablet take 1 tablet by mouth twice a day 180 tablet 1  . Multiple Vitamins-Minerals (CENTRUM SILVER ADULT 50+) TABS Take 1 tablet by mouth daily.    . [DISCONTINUED] lisinopril (PRINIVIL,ZESTRIL) 20 MG tablet Take 1 tablet (20 mg total) by mouth daily. 30 tablet 0   No current facility-administered medications on file prior  to visit.     No Known Allergies  Family History  Problem Relation Age of Onset  . Hypertension Mother        Living  . Thyroid disease Mother   . Diabetes Father 10       Deceased  . Obesity Father   . Heart attack Father   . Diabetes Other        Paternal Side of Family  . Alzheimer's disease Maternal Grandmother   . Diabetes Sister   . Healthy Son        x3  . Colon cancer Neg Hx   . Rectal cancer Neg Hx   . Stomach cancer Neg Hx     Social History   Socioeconomic History  . Marital status: Married    Spouse name: Not on file  . Number of children: Not on file  . Years of education: Not on file  . Highest education level: Not on file  Social Needs  . Financial resource strain: Not on file  . Food insecurity - worry: Not on file  . Food insecurity - inability: Not on file  . Transportation needs - medical: Not on file  . Transportation needs - non-medical: Not on file  Occupational History  . Not on file  Tobacco Use  . Smoking status: Never Smoker  . Smokeless tobacco: Never Used  Substance and Sexual Activity  . Alcohol use: Yes    Alcohol/week: 6.0 oz    Types: 10 Shots of liquor per week  . Drug use: No  . Sexual activity: Not on file  Other Topics Concern  .  Not on file  Social History Narrative  . Not on file    Review of Systems  Constitutional: Negative for fever and weight loss.  HENT: Negative for ear discharge, ear pain, hearing loss and tinnitus.   Eyes: Negative for blurred vision, double vision, photophobia and pain.  Respiratory: Negative for cough and shortness of breath.   Cardiovascular: Negative for chest pain and palpitations.  Gastrointestinal: Negative for abdominal pain, blood in stool, constipation, diarrhea, heartburn, melena, nausea and vomiting.  Genitourinary: Negative for dysuria, flank pain, frequency, hematuria and urgency.  Musculoskeletal: Positive for joint pain (Chronic L knee pain -- sees Ortho). Negative for falls.   Skin: Positive for rash.  Neurological: Negative for dizziness, loss of consciousness and headaches.  Endo/Heme/Allergies: Negative for environmental allergies.  Psychiatric/Behavioral: Negative for depression, hallucinations, substance abuse and suicidal ideas. The patient is not nervous/anxious and does not have insomnia.    BP 120/80   Pulse 62   Temp 98.1 F (36.7 C) (Oral)   Resp 14   Ht 6' (1.829 m)   Wt 206 lb (93.4 kg)   SpO2 98%   BMI 27.94 kg/m   Physical Exam  Constitutional: He is oriented to person, place, and time and well-developed, well-nourished, and in no distress.  HENT:  Head: Normocephalic and atraumatic.  Right Ear: External ear normal.  Left Ear: External ear normal.  Nose: Nose normal.  Mouth/Throat: Oropharynx is clear and moist. No oropharyngeal exudate.  Eyes: Conjunctivae and EOM are normal. Pupils are equal, round, and reactive to light.  Neck: Neck supple. No thyromegaly present.  Cardiovascular: Normal rate, regular rhythm, normal heart sounds and intact distal pulses.  Pulmonary/Chest: Effort normal and breath sounds normal. No respiratory distress. He has no wheezes. He has no rales. He exhibits no tenderness.  Abdominal: Soft. Bowel sounds are normal. He exhibits no distension and no mass. There is no tenderness. There is no rebound and no guarding.  Genitourinary: Testes/scrotum normal.  Lymphadenopathy:    He has no cervical adenopathy.  Neurological: He is alert and oriented to person, place, and time.  Skin: Skin is warm and dry.     Psychiatric: Affect normal.  Vitals reviewed.  Assessment/Plan: Hypertension BP well-controlled. Asymptomatic. Continue current regimen. CMP today.  Rash and nonspecific skin eruption Most likely yeast/fungal. Start trial of clotriamzole-betamethasone. Foot hygiene discussed. Follow-up if not resolving.  Visit for preventive health examination Depression screen negative. Health Maintenance reviewed  -- Flu shot updated. Needs colonoscopy. Referral placed. Preventive schedule discussed and handout given in AVS. Will obtain fasting labs today.   Prostate cancer screening The natural history of prostate cancer and ongoing controversy regarding screening and potential treatment outcomes of prostate cancer has been discussed with the patient. The meaning of a false positive PSA and a false negative PSA has been discussed. He indicates understanding of the limitations of this screening test and wishes to proceed with screening PSA testing.   Hyperlipidemia Repeat lipids and LFT today. Dietary and exercise recommendations given.  Colon cancer screening Referral for screening colonoscopy placed.  Anxiety and depression Doing well off of medication. Will monitor.     Leeanne Rio, PA-C

## 2017-02-19 NOTE — Progress Notes (Signed)
Pre visit review using our clinic review tool, if applicable. No additional management support is needed unless otherwise documented below in the visit note. 

## 2017-02-22 ENCOUNTER — Other Ambulatory Visit: Payer: Self-pay | Admitting: Physician Assistant

## 2017-02-22 DIAGNOSIS — E785 Hyperlipidemia, unspecified: Secondary | ICD-10-CM

## 2017-02-22 MED ORDER — ROSUVASTATIN CALCIUM 20 MG PO TABS: 20.0000 mg | ORAL_TABLET | Freq: Every day | ORAL | 0 refills | Status: DC

## 2017-02-23 ENCOUNTER — Telehealth: Payer: Self-pay | Admitting: *Deleted

## 2017-02-23 NOTE — Telephone Encounter (Signed)
LM for patient to call me back to discuss.

## 2017-02-23 NOTE — Telephone Encounter (Signed)
Patient previously on Crestor. We had tried to get Livalo but this was the medication that was denied. Is he wanting me to re-attempt to get it covered this go round?

## 2017-02-23 NOTE — Telephone Encounter (Signed)
Copied from [place] [id number]. Topic: Inquiry >> Feb 23, 2017 12:00 PM Cecelia Byars, NT wrote: Reason for CRM: Patient wants to know the name of the medicine that was prescribed  by [facility] and the insurance declined ,it was a Counsellor medicine

## 2017-03-10 ENCOUNTER — Ambulatory Visit: Payer: Self-pay | Admitting: Physician Assistant

## 2017-03-10 ENCOUNTER — Other Ambulatory Visit: Payer: Self-pay | Admitting: Physician Assistant

## 2017-05-04 ENCOUNTER — Encounter: Payer: Self-pay | Admitting: Gastroenterology

## 2017-09-07 ENCOUNTER — Other Ambulatory Visit: Payer: Self-pay | Admitting: Emergency Medicine

## 2017-09-07 MED ORDER — LISINOPRIL-HYDROCHLOROTHIAZIDE 20-12.5 MG PO TABS: 1.0000 | ORAL_TABLET | Freq: Two times a day (BID) | ORAL | 0 refills | Status: DC

## 2017-10-05 ENCOUNTER — Other Ambulatory Visit: Payer: Self-pay | Admitting: Physician Assistant

## 2017-10-20 ENCOUNTER — Encounter: Payer: Self-pay | Admitting: Physician Assistant

## 2017-10-20 ENCOUNTER — Other Ambulatory Visit: Payer: Self-pay | Admitting: Physician Assistant

## 2017-10-20 ENCOUNTER — Ambulatory Visit: Payer: 59 | Admitting: Physician Assistant

## 2017-10-20 ENCOUNTER — Other Ambulatory Visit: Payer: Self-pay

## 2017-10-20 VITALS — BP 110/70 | HR 63 | Temp 97.9°F | Resp 16 | Ht 72.0 in | Wt 213.0 lb

## 2017-10-20 DIAGNOSIS — E785 Hyperlipidemia, unspecified: Secondary | ICD-10-CM | POA: Diagnosis not present

## 2017-10-20 DIAGNOSIS — R7309 Other abnormal glucose: Secondary | ICD-10-CM

## 2017-10-20 DIAGNOSIS — I10 Essential (primary) hypertension: Secondary | ICD-10-CM | POA: Diagnosis not present

## 2017-10-20 LAB — RENAL FUNCTION PANEL
Albumin: 4.5 g/dL (ref 3.5–5.2)
BUN: 18 mg/dL (ref 6–23)
CHLORIDE: 99 meq/L (ref 96–112)
CO2: 28 mEq/L (ref 19–32)
CREATININE: 0.91 mg/dL (ref 0.40–1.50)
Calcium: 9.4 mg/dL (ref 8.4–10.5)
GFR: 92.26 mL/min (ref >60.00)
Glucose, Bld: 116 mg/dL — ABNORMAL HIGH (ref 70–99)
PHOSPHORUS: 3.7 mg/dL (ref 2.3–4.6)
Potassium: 4.4 mEq/L (ref 3.5–5.1)
SODIUM: 135 meq/L (ref 135–145)

## 2017-10-20 LAB — LDL CHOLESTEROL, DIRECT: LDL DIRECT: 176 mg/dL

## 2017-10-20 LAB — HEPATIC FUNCTION PANEL
ALT: 40 U/L (ref 0–53)
AST: 29 U/L (ref 0–37)
Albumin: 4.5 g/dL (ref 3.5–5.2)
Alkaline Phosphatase: 78 U/L (ref 39–117)
BILIRUBIN TOTAL: 0.3 mg/dL (ref 0.2–1.2)
Bilirubin, Direct: 0.1 mg/dL (ref 0.0–0.3)
TOTAL PROTEIN: 7.3 g/dL (ref 6.0–8.3)

## 2017-10-20 LAB — LIPID PANEL
CHOL/HDL RATIO: 7
Cholesterol: 290 mg/dL — ABNORMAL HIGH (ref 0–200)
HDL: 39.1 mg/dL (ref >39.00)
NonHDL: 251.15
TRIGLYCERIDES: 398 mg/dL — AB (ref 0.0–149.0)
VLDL: 79.6 mg/dL — AB (ref 0.0–40.0)

## 2017-10-20 MED ORDER — ROSUVASTATIN CALCIUM 20 MG PO TABS: 20.0000 mg | ORAL_TABLET | Freq: Every day | ORAL | 0 refills | Status: DC

## 2017-10-20 MED ORDER — LISINOPRIL-HYDROCHLOROTHIAZIDE 20-12.5 MG PO TABS: ORAL_TABLET | ORAL | 1 refills | Status: DC

## 2017-10-20 NOTE — Assessment & Plan Note (Signed)
Restart Crestor. Dietary and exercise recommendations reviewed. Fasting panel today.

## 2017-10-20 NOTE — Progress Notes (Signed)
Patient presents to clinic today for follow-up of hypertension and hyperlipidemia.   Hypertension -- Is currently on a regimen of lisinopril-HCTZ 20-12.5 mg. Endorses taking medication as directed. Patient denies chest pain, palpitations, lightheadedness, dizziness, vision changes or frequent headaches.  BP Readings from Last 3 Encounters:  10/20/17 110/70  02/19/17 120/80  06/17/16 118/80   Hyperlipidemia -- Is prescribed Crestor 20 mg daily. Is not currently taking medication, but did very well on this the last time he was taking. Is trying to eat more of a well-balanced diet. Stays very active at work..   Past Medical History:  Diagnosis Date  . Allergy    seasonal  . High cholesterol   . Hypertension   . Sinusitis     Current Outpatient Medications on File Prior to Visit  Medication Sig Dispense Refill  . ibuprofen (ADVIL,MOTRIN) 200 MG tablet Take 200 mg by mouth every 6 (six) hours as needed.    . Multiple Vitamins-Minerals (CENTRUM SILVER ADULT 50+) TABS Take 1 tablet by mouth daily.    . [DISCONTINUED] lisinopril (PRINIVIL,ZESTRIL) 20 MG tablet Take 1 tablet (20 mg total) by mouth daily. 30 tablet 0   No current facility-administered medications on file prior to visit.     No Known Allergies  Family History  Problem Relation Age of Onset  . Hypertension Mother        Living  . Thyroid disease Mother   . Diabetes Father 85       Deceased  . Obesity Father   . Heart attack Father   . Diabetes Other        Paternal Side of Family  . Alzheimer's disease Maternal Grandmother   . Diabetes Sister   . Healthy Son        x3  . Colon cancer Neg Hx   . Rectal cancer Neg Hx   . Stomach cancer Neg Hx     Social History   Socioeconomic History  . Marital status: Married    Spouse name: Not on file  . Number of children: Not on file  . Years of education: Not on file  . Highest education level: Not on file  Occupational History  . Not on file  Social Needs    . Financial resource strain: Not on file  . Food insecurity:    Worry: Not on file    Inability: Not on file  . Transportation needs:    Medical: Not on file    Non-medical: Not on file  Tobacco Use  . Smoking status: Never Smoker  . Smokeless tobacco: Never Used  Substance and Sexual Activity  . Alcohol use: Yes    Alcohol/week: 6.0 oz    Types: 10 Shots of liquor per week  . Drug use: No  . Sexual activity: Not on file  Lifestyle  . Physical activity:    Days per week: Not on file    Minutes per session: Not on file  . Stress: Not on file  Relationships  . Social connections:    Talks on phone: Not on file    Gets together: Not on file    Attends religious service: Not on file    Active member of club or organization: Not on file    Attends meetings of clubs or organizations: Not on file    Relationship status: Not on file  Other Topics Concern  . Not on file  Social History Narrative  . Not on file   Review of  Systems - See HPI.  All other ROS are negative.  BP 110/70   Pulse 63   Temp 97.9 F (36.6 C) (Oral)   Resp 16   Ht 6' (1.829 m)   Wt 213 lb (96.6 kg)   SpO2 99%   BMI 28.89 kg/m   Physical Exam  Constitutional: He is oriented to person, place, and time. He appears well-developed and well-nourished.  HENT:  Head: Normocephalic and atraumatic.  Cardiovascular: Normal rate, regular rhythm, normal heart sounds and intact distal pulses.  Pulmonary/Chest: Effort normal and breath sounds normal. No stridor. No respiratory distress. He has no wheezes. He has no rales. He exhibits no tenderness.  Neurological: He is alert and oriented to person, place, and time.  Psychiatric: He has a normal mood and affect.  Vitals reviewed.  Assessment/Plan: Hypertension BP normotensive. Asymptomatic. Continue current regimen. Will check renal panel today. Dietary and exercise recommendations reviewed.  Hyperlipidemia Restart Crestor. Dietary and exercise  recommendations reviewed. Fasting panel today.    Leeanne Rio, PA-C

## 2017-10-20 NOTE — Assessment & Plan Note (Signed)
BP normotensive. Asymptomatic. Continue current regimen. Will check renal panel today. Dietary and exercise recommendations reviewed.

## 2017-10-20 NOTE — Patient Instructions (Signed)
Please go to the lab today for blood work.  I will call you with your results. We will alter treatment regimen(s) if indicated by your results.   Continue the BP medication as directed. We are restarting the Crestor. Let me know if there is any issue with cost of the generic (rosuvastatin).    High Cholesterol High cholesterol is a condition in which the blood has high levels of a white, waxy, fat-like substance (cholesterol). The human body needs small amounts of cholesterol. The liver makes all the cholesterol that the body needs. Extra (excess) cholesterol comes from the food that we eat. Cholesterol is carried from the liver by the blood through the blood vessels. If you have high cholesterol, deposits (plaques) may build up on the walls of your blood vessels (arteries). Plaques make the arteries narrower and stiffer. Cholesterol plaques increase your risk for heart attack and stroke. Work with your health care provider to keep your cholesterol levels in a healthy range. What increases the risk? This condition is more likely to develop in people who:  Eat foods that are high in animal fat (saturated fat) or cholesterol.  Are overweight.  Are not getting enough exercise.  Have a family history of high cholesterol.  What are the signs or symptoms? There are no symptoms of this condition. How is this diagnosed? This condition may be diagnosed from the results of a blood test.  If you are older than age 61, your health care provider may check your cholesterol every 4-6 years.  You may be checked more often if you already have high cholesterol or other risk factors for heart disease.  The blood test for cholesterol measures:  "Bad" cholesterol (LDL cholesterol). This is the main type of cholesterol that causes heart disease. The desired level for LDL is less than 100.  "Good" cholesterol (HDL cholesterol). This type helps to protect against heart disease by cleaning the arteries and  carrying the LDL away. The desired level for HDL is 60 or higher.  Triglycerides. These are fats that the body can store or burn for energy. The desired number for triglycerides is lower than 150.  Total cholesterol. This is a measure of the total amount of cholesterol in your blood, including LDL cholesterol, HDL cholesterol, and triglycerides. A healthy number is less than 200.  How is this treated? This condition is treated with diet changes, lifestyle changes, and medicines. Diet changes  This may include eating more whole grains, fruits, vegetables, nuts, and fish.  This may also include cutting back on red meat and foods that have a lot of added sugar. Lifestyle changes  Changes may include getting at least 40 minutes of aerobic exercise 3 times a week. Aerobic exercises include walking, biking, and swimming. Aerobic exercise along with a healthy diet can help you maintain a healthy weight.  Changes may also include quitting smoking. Medicines  Medicines are usually given if diet and lifestyle changes have failed to reduce your cholesterol to healthy levels.  Your health care provider may prescribe a statin medicine. Statin medicines have been shown to reduce cholesterol, which can reduce the risk of heart disease. Follow these instructions at home: Eating and drinking  If told by your health care provider:  Eat chicken (without skin), fish, veal, shellfish, ground [place] breast, and round or loin cuts of red meat.  Do not eat fried foods or fatty meats, such as hot dogs and salami.  Eat plenty of fruits, such as apples.  Eat plenty of vegetables, such as broccoli, potatoes, and carrots.  Eat beans, peas, and lentils.  Eat grains such as barley, rice, couscous, and bulgur wheat.  Eat pasta without cream sauces.  Use skim or nonfat milk, and eat low-fat or nonfat yogurt and cheeses.  Do not eat or drink whole milk, cream, ice cream, egg yolks, or hard cheeses.  Do  not eat stick margarine or tub margarines that contain trans fats (also called partially hydrogenated oils).  Do not eat saturated tropical oils, such as coconut oil and palm oil.  Do not eat cakes, cookies, crackers, or other baked goods that contain trans fats.  General instructions  Exercise as directed by your health care provider. Increase your activity level with activities such as gardening, walking, and taking the stairs.  Take over-the-counter and prescription medicines only as told by your health care provider.  Do not use any products that contain nicotine or tobacco, such as cigarettes and e-cigarettes. If you need help quitting, ask your health care provider.  Keep all follow-up visits as told by your health care provider. This is important. Contact a health care provider if:  You are struggling to maintain a healthy diet or weight.  You need help to start on an exercise program.  You need help to stop smoking. Get help right away if:  You have chest pain.  You have trouble breathing. This information is not intended to replace advice given to you by your health care provider. Make sure you discuss any questions you have with your health care provider. Document Released: 03/23/2005 Document Revised: 10/19/2015 Document Reviewed: 09/21/2015 Elsevier Interactive Patient Education  [employer].

## 2017-10-21 ENCOUNTER — Other Ambulatory Visit (INDEPENDENT_AMBULATORY_CARE_PROVIDER_SITE_OTHER): Payer: 59

## 2017-10-21 DIAGNOSIS — R7309 Other abnormal glucose: Secondary | ICD-10-CM

## 2017-10-21 LAB — HEMOGLOBIN A1C: Hgb A1c MFr Bld: 6.3 % (ref 4.6–6.5)

## 2017-10-28 ENCOUNTER — Telehealth: Payer: Self-pay | Admitting: Physician Assistant

## 2017-10-28 DIAGNOSIS — F419 Anxiety disorder, unspecified: Principal | ICD-10-CM

## 2017-10-28 DIAGNOSIS — F329 Major depressive disorder, single episode, unspecified: Secondary | ICD-10-CM

## 2017-10-28 DIAGNOSIS — F32A Depression, unspecified: Secondary | ICD-10-CM

## 2017-10-28 MED ORDER — SERTRALINE HCL 50 MG PO TABS: ORAL_TABLET | ORAL | 1 refills | Status: DC

## 2017-10-28 NOTE — Telephone Encounter (Signed)
Copied from [place] [phone] [id number]. Topic: Quick Communication - See Telephone Encounter >> Oct 28, 2017 10:00 AM Conception Chancy, NT wrote: CRM for notification. See Telephone encounter for: 10/28/17.  Patient Is calling and states he would like to try Zoloft again. Please advise.   [facility] [address] - [place], [place] - [address] [place] [address] Phone: [phone] Fax: [phone]

## 2017-10-28 NOTE — Telephone Encounter (Signed)
Ok to restart Sertraline 50 mg tablet. Take 1/2 tablet daily for 2 weeks before increasing to whole tablet daily. Follow-up in 6-8 weeks.

## 2017-10-28 NOTE — Telephone Encounter (Signed)
Please advise about medication

## 2017-10-28 NOTE — Telephone Encounter (Signed)
Left a detailed message on VM advising patient if he needs to restart the Sertraline we can. Take 1/2 tablet daily x 2 weeks then increase to 1 tablet daily. Schedule a follow up appointment in 6-8 weeks for recheck. Rx sent to the [facility] pharmacy

## 2018-01-20 ENCOUNTER — Other Ambulatory Visit: Payer: Self-pay | Admitting: *Deleted

## 2018-01-20 DIAGNOSIS — E785 Hyperlipidemia, unspecified: Secondary | ICD-10-CM

## 2018-01-25 ENCOUNTER — Other Ambulatory Visit (INDEPENDENT_AMBULATORY_CARE_PROVIDER_SITE_OTHER): Payer: 59

## 2018-01-25 ENCOUNTER — Other Ambulatory Visit: Payer: Self-pay | Admitting: General Practice

## 2018-01-25 DIAGNOSIS — E785 Hyperlipidemia, unspecified: Secondary | ICD-10-CM | POA: Diagnosis not present

## 2018-01-25 DIAGNOSIS — I10 Essential (primary) hypertension: Secondary | ICD-10-CM

## 2018-01-25 LAB — LIPID PANEL
Cholesterol: 206 mg/dL — ABNORMAL HIGH (ref 0–200)
HDL: 47.8 mg/dL (ref >39.00)
NonHDL: 157.8
Total CHOL/HDL Ratio: 4
Triglycerides: 258 mg/dL — ABNORMAL HIGH (ref 0.0–149.0)
VLDL: 51.6 mg/dL — AB (ref 0.0–40.0)

## 2018-01-25 LAB — HEPATIC FUNCTION PANEL
ALT: 42 U/L (ref 0–53)
AST: 32 U/L (ref 0–37)
Albumin: 4.7 g/dL (ref 3.5–5.2)
Alkaline Phosphatase: 80 U/L (ref 39–117)
BILIRUBIN DIRECT: 0.1 mg/dL (ref 0.0–0.3)
BILIRUBIN TOTAL: 0.5 mg/dL (ref 0.2–1.2)
Total Protein: 7.1 g/dL (ref 6.0–8.3)

## 2018-01-25 LAB — LDL CHOLESTEROL, DIRECT: LDL DIRECT: 134 mg/dL

## 2018-01-25 MED ORDER — LISINOPRIL-HYDROCHLOROTHIAZIDE 20-12.5 MG PO TABS: ORAL_TABLET | ORAL | 1 refills | Status: DC

## 2018-02-01 ENCOUNTER — Other Ambulatory Visit: Payer: Self-pay | Admitting: Physician Assistant

## 2018-02-01 DIAGNOSIS — E785 Hyperlipidemia, unspecified: Secondary | ICD-10-CM

## 2018-02-01 MED ORDER — ROSUVASTATIN CALCIUM 20 MG PO TABS: 20.0000 mg | ORAL_TABLET | Freq: Every day | ORAL | 1 refills | Status: DC

## 2018-02-01 NOTE — Telephone Encounter (Signed)
Copied from [place] [id number]. Topic: Quick Communication - Rx Refill/Question >> Feb 01, 2018 10:35 AM Reyne [place] L wrote: Medication: rosuvastatin (CRESTOR) 20 MG tablet  Has the patient contacted their pharmacy? Yes - pharmacy states it was faxed over on 10/24, but they have heard no response (Agent: If no, request that the patient contact the pharmacy for the refill) (Agent: If yes, when and what did the pharmacy advise?)  Preferred Pharmacy (with phone number or street name):   [facility] [address] - [place], [place] - [phone] (Phone) [phone] (Fax)  Agent: Please be advised that RX refills may take up to 3 business days. We ask that you follow-up with your pharmacy.

## 2018-04-17 ENCOUNTER — Other Ambulatory Visit: Payer: Self-pay | Admitting: Physician Assistant

## 2018-04-17 DIAGNOSIS — I10 Essential (primary) hypertension: Secondary | ICD-10-CM

## 2018-05-26 ENCOUNTER — Telehealth: Payer: Self-pay

## 2018-05-26 NOTE — Telephone Encounter (Signed)
OK w me.  

## 2018-05-26 NOTE — Telephone Encounter (Signed)
Copied from [place] [id number]. Topic: Appointment Scheduling - Transfer of Care >> May 26, 2018  2:58 PM Yvette Rack wrote: Pt is requesting to transfer FROM: Gwyndolyn Saxon "Einar Pheasant" Hassell Done Pt is requesting to transfer TO: Dr. Nani Ravens  Reason for requested transfer: [facility] is closer to patient's home  Send CRM to patient's current PCP (transferring FROM).

## 2018-05-27 NOTE — Telephone Encounter (Signed)
LVM for pt to call our office to schedule transfer office appt. Ok to schedule with Dr Nani Ravens.

## 2018-05-27 NOTE — Telephone Encounter (Signed)
Fine with me

## 2018-06-10 ENCOUNTER — Encounter: Payer: Self-pay | Admitting: Family Medicine

## 2018-06-10 ENCOUNTER — Ambulatory Visit: Payer: No Typology Code available for payment source | Admitting: Family Medicine

## 2018-06-10 VITALS — BP 112/68 | HR 68 | Temp 98.3°F | Ht 72.0 in | Wt 210.1 lb

## 2018-06-10 DIAGNOSIS — F419 Anxiety disorder, unspecified: Secondary | ICD-10-CM | POA: Diagnosis not present

## 2018-06-10 DIAGNOSIS — E785 Hyperlipidemia, unspecified: Secondary | ICD-10-CM | POA: Diagnosis not present

## 2018-06-10 DIAGNOSIS — F329 Major depressive disorder, single episode, unspecified: Secondary | ICD-10-CM | POA: Diagnosis not present

## 2018-06-10 DIAGNOSIS — F32A Depression, unspecified: Secondary | ICD-10-CM

## 2018-06-10 DIAGNOSIS — I10 Essential (primary) hypertension: Secondary | ICD-10-CM

## 2018-06-10 LAB — LIPID PANEL
CHOL/HDL RATIO: 4
Cholesterol: 193 mg/dL (ref 0–200)
HDL: 47.3 mg/dL (ref >39.00)
LDL Cholesterol: 110 mg/dL — ABNORMAL HIGH (ref 0–99)
NonHDL: 146.07
Triglycerides: 179 mg/dL — ABNORMAL HIGH (ref 0.0–149.0)
VLDL: 35.8 mg/dL (ref 0.0–40.0)

## 2018-06-10 MED ORDER — LISINOPRIL-HYDROCHLOROTHIAZIDE 20-12.5 MG PO TABS: 1.0000 | ORAL_TABLET | Freq: Two times a day (BID) | ORAL | 2 refills | Status: DC

## 2018-06-10 MED ORDER — ESCITALOPRAM OXALATE 5 MG PO TABS: 5.0000 mg | ORAL_TABLET | Freq: Every day | ORAL | 3 refills | Status: DC

## 2018-06-10 MED ORDER — ROSUVASTATIN CALCIUM 20 MG PO TABS: 20.0000 mg | ORAL_TABLET | Freq: Every day | ORAL | 2 refills | Status: DC

## 2018-06-10 MED ORDER — HYDROXYZINE HCL 25 MG PO TABS: 25.0000 mg | ORAL_TABLET | Freq: Three times a day (TID) | ORAL | 1 refills | Status: DC | PRN

## 2018-06-10 NOTE — Progress Notes (Signed)
Chief Complaint  Patient presents with  . New Patient (Initial Visit)    Subjective William Shea presents for f/u anxiety/depression.  He is currently being treated with Zoloft- does not like the way it makes him feel.  Reports no improvement, but was unable to get on with it due to AE's.  No thoughts of harming self or others. No self-medication with alcohol, prescription drugs. Dad had stress like this also. No other stressors other than work. Pt is not following with a counselor/psychologist. He has in the past and did not do well.   Hypertension Patient presents for hypertension follow up. He does not routinely monitor home blood pressures. He is compliant with medications. Patient has these side effects of medication: none He is not routinely adhering to a healthy diet overall. Exercise: physically active at work   ROS Psych: No homicidal or suicidal thoughts Cardiac: No CP  Past Medical History:  Diagnosis Date  . Allergy    seasonal  . High cholesterol   . Hypertension   . Sinusitis    Exam BP 112/68 (BP Location: Left Arm, Patient Position: Sitting, Cuff Size: Large)   Pulse 68   Temp 98.3 F (36.8 C) (Oral)   Ht 6' (1.829 m)   Wt 210 lb 2 oz (95.3 kg)   SpO2 98%   BMI 28.50 kg/m  General:  well developed, well nourished, in no apparent distress Lungs:  clear to auscultation, breath sounds equal bilaterally, no respiratory distress Cardio:  regular rate and rhythm, no bruits, no LE edema Psych: well oriented with normal range of affect and age-appropriate judgement/insight, alert and oriented x4.  Assessment and Plan  Anxiety and depression - Plan: escitalopram (LEXAPRO) 5 MG tablet, hydrOXYzine (ATARAX/VISTARIL) 25 MG tablet  Essential hypertension - Plan:  lisinopril-hydrochlorothiazide (PRINZIDE,ZESTORETIC) 20-12.5 MG tablet  Hyperlipidemia, unspecified hyperlipidemia type - Plan: rosuvastatin (CRESTOR) 20 MG tablet, Lipid panel  Orders as above.  Stop Zoloft, start Lexapro, lose dose. Counseled on exercise. Consider a counselor. [facility] # provided. F/u in 6 weeks to reck. The patient voiced understanding and agreement to the plan.  [place], DO 06/10/18 8:30 AM

## 2018-06-10 NOTE — Patient Instructions (Addendum)
Please consider counseling. Contact [phone] to schedule an appointment or inquire about cost/insurance coverage.  Aim to do some physical exertion for 150 minutes per week. This is typically divided into 5 days per week, 30 minutes per day. The activity should be enough to get your heart rate up. Anything is better than nothing if you have time constraints.  Give [place] 2-3 business days to get the results of your labs back.   Coping skills Choose 5 that work for you:  Take a deep breath  Count to 20  Read a book  Do a puzzle  Meditate  Bake  Sing  Knit  Garden  Pray  Go outside  Call a friend  Listen to music  Take a walk  Color  Send a note  Take a bath  Watch a movie  Be alone in a quiet place  Pet an animal  Visit a friend  Journal  Exercise  Stretch   Let us know if you need anything.

## 2018-12-21 ENCOUNTER — Other Ambulatory Visit: Payer: Self-pay | Admitting: Family Medicine

## 2018-12-21 DIAGNOSIS — F32A Depression, unspecified: Secondary | ICD-10-CM

## 2018-12-21 DIAGNOSIS — F419 Anxiety disorder, unspecified: Secondary | ICD-10-CM

## 2018-12-21 DIAGNOSIS — F329 Major depressive disorder, single episode, unspecified: Secondary | ICD-10-CM

## 2019-02-16 ENCOUNTER — Other Ambulatory Visit: Payer: Self-pay | Admitting: Family Medicine

## 2019-02-16 DIAGNOSIS — E785 Hyperlipidemia, unspecified: Secondary | ICD-10-CM

## 2019-02-16 NOTE — Telephone Encounter (Signed)
Crestor refill sent to pharmacy. Pt is past due for CPE. Mailed letter to call and schedule appointment.

## 2019-02-23 ENCOUNTER — Ambulatory Visit: Payer: Self-pay | Admitting: Family Medicine

## 2019-02-23 NOTE — Telephone Encounter (Signed)
Pt reports mild SOB with exertion only, "Going up stairs, strenuous activity at work." Onset 8 months ago, noted some worsening 3 months ago. States has appt for physical 03/10/2019, calling to see if he could be seen earlier. Also reports chest tightness when exertion occurs, "From both shoulders to breasts." Pt states 15 lb weight gain "Since Covid, not eating right and drinking at night."  Denies fever, no known covid exposure, no travel, no cough or GI issues. Attempted to reach practice, recording. Assured pt [place] would route to practice for Dr. Irene Limbo review. Advised if not able to be seen due to symptoms or availability today or tomorrow, go to UC. Advised if symptoms worsen, go to ED.  Pt verbalizes understanding.  Please advise: [phone]  Reason for Disposition . [1] MODERATE longstanding difficulty breathing (e.g., speaks in phrases, SOB even at rest, pulse 100-120) AND [2] SAME as normal    States ""A little worse only with strenuous activity at work."  Onset 3 months ago, ongoing for 8 months.  Answer Assessment - Initial Assessment Questions 1. RESPIRATORY STATUS: "Describe your breathing?" (e.g., wheezing, shortness of breath, unable to speak, severe coughing)      With exertion 2. ONSET: "When did this breathing problem begin?"      8 months ago, worsening past 3 months 3. PATTERN "Does the difficult breathing come and go, or has it been constant since it started?"      With exertion 4. SEVERITY: "How bad is your breathing?" (e.g., mild, moderate, severe)    - MILD: No SOB at rest, mild SOB with walking, speaks normally in sentences, can lay down, no retractions, pulse < 100.    - MODERATE: SOB at rest, SOB with minimal exertion and prefers to sit, cannot lie down flat, speaks in phrases, mild retractions, audible wheezing, pulse 100-120.    - SEVERE: Very SOB at rest, speaks in single words, struggling to breathe, sitting hunched forward, retractions, pulse > 120   Moderate with strenuous. activity. 5. RECURRENT SYMPTOM: "Have you had difficulty breathing before?" If so, ask: "When was the last time?" and "What happened that time?"      No 6. CARDIAC HISTORY: "Do you have any history of heart disease?" (e.g., heart attack, angina, bypass surgery, angioplasty)      no 7. LUNG HISTORY: "Do you have any history of lung disease?"  (e.g., pulmonary embolus, asthma, emphysema)     no 8. CAUSE: "What do you think is causing the breathing problem?"      Unsure 9. OTHER SYMPTOMS: "Do you have any other symptoms? (e.g., dizziness, runny nose, cough, chest pain, fever)     Gained 15lbs since covid; overeating. 11. TRAVEL: "Have you traveled out of the country in the last month?" (e.g., travel history, exposures)       no  Protocols used: BREATHING DIFFICULTY-A-AH

## 2019-02-23 NOTE — Telephone Encounter (Signed)
Spoke with patient regarding symptoms.  Patient reports SOB and chest tightness with exertion x several months, worsening x 1 month. Reports pain/tightness is 5 out of 10 pain scale, lasting less than 1 minute (resolves when breathing normal). Denies diaphoresis, weakness, fatigue or blurred vision. Afebrile, no known COVID exposure.  Scheduled VV with PCP. Advised if symptoms worsen, go to ER for evaluation. Patient verbalized understanding.

## 2019-02-23 NOTE — Telephone Encounter (Signed)
LM requesting return call to discuss symptoms.  

## 2019-02-23 NOTE — Telephone Encounter (Signed)
I tried to call and got his VM- was trying to see if he is willing to do a VV with another provider?

## 2019-02-24 ENCOUNTER — Other Ambulatory Visit: Payer: Self-pay

## 2019-02-24 ENCOUNTER — Encounter: Payer: Self-pay | Admitting: Family Medicine

## 2019-02-24 ENCOUNTER — Ambulatory Visit (INDEPENDENT_AMBULATORY_CARE_PROVIDER_SITE_OTHER): Payer: No Typology Code available for payment source | Admitting: Family Medicine

## 2019-02-24 DIAGNOSIS — R0600 Dyspnea, unspecified: Secondary | ICD-10-CM

## 2019-02-24 DIAGNOSIS — R062 Wheezing: Secondary | ICD-10-CM | POA: Diagnosis not present

## 2019-02-24 DIAGNOSIS — R0609 Other forms of dyspnea: Secondary | ICD-10-CM

## 2019-02-24 MED ORDER — ALBUTEROL SULFATE HFA 108 (90 BASE) MCG/ACT IN AERS: 2.0000 | INHALATION_SPRAY | Freq: Four times a day (QID) | RESPIRATORY_TRACT | 1 refills | Status: DC | PRN

## 2019-02-24 NOTE — Progress Notes (Signed)
Chief Complaint  Patient presents with  . Shortness of Breath    has worsened (tightness/pain a couple of months)    Subjective: Patient is a 53 y.o. male here for sob. Due to COVID-19 pandemic, we are interacting via web portal for an electronic face-to-face visit. I verified patient's ID using 2 identifiers. Patient agreed to proceed with visit via this method. Patient is in a car, I am at office. Patient and I are present for visit.    SOB over past 6-8 mo, worsening over past mo. Has gained some weight due to pandemic. Worse w exertion. Able to catch breath within 2 minutes. Describes burning in chest like he is breathing in cold air. +some wheezing. No swelling in legs.  He is not coughing and he denies calf pain.  No recent period of inactivity, surgery, or history of clotting.  He is driving currently and has no shortness of breath.  ROS: Heart: Denies chest pain  Lungs: +SOB   Past Medical History:  Diagnosis Date  . Allergy    seasonal  . High cholesterol   . Hypertension   . Sinusitis     Objective: No conversational dyspnea Age appropriate judgment and insight Nml affect and mood  Assessment and Plan: DOE (dyspnea on exertion) - Plan: albuterol (VENTOLIN HFA) 108 (90 Base) MCG/ACT inhaler  Wheeze - Plan: albuterol (VENTOLIN HFA) 108 (90 Base) MCG/ACT inhaler  Likely multifactorial.  Feeling like he is breathing in cold water makes me think bronchospasm.  Will trial short acting beta agonist prior to physical activity.  It does not sound like his heart is involved.  He does have a physical in 2 weeks where I will consider an x-ray and I would like to get an EKG. The patient voiced understanding and agreement to the plan.  [place], DO 02/24/19  4:07 PM

## 2019-03-01 ENCOUNTER — Other Ambulatory Visit: Payer: Self-pay

## 2019-03-01 DIAGNOSIS — Z20822 Contact with and (suspected) exposure to covid-19: Secondary | ICD-10-CM

## 2019-03-02 LAB — NOVEL CORONAVIRUS, NAA: SARS-CoV-2, NAA: NOT DETECTED

## 2019-03-09 ENCOUNTER — Other Ambulatory Visit: Payer: Self-pay

## 2019-03-10 ENCOUNTER — Other Ambulatory Visit: Payer: Self-pay

## 2019-03-10 ENCOUNTER — Ambulatory Visit (INDEPENDENT_AMBULATORY_CARE_PROVIDER_SITE_OTHER): Payer: No Typology Code available for payment source | Admitting: Family Medicine

## 2019-03-10 ENCOUNTER — Encounter: Payer: Self-pay | Admitting: Family Medicine

## 2019-03-10 VITALS — BP 102/72 | HR 72 | Temp 97.6°F | Resp 18 | Ht 72.0 in | Wt 209.4 lb

## 2019-03-10 DIAGNOSIS — Z125 Encounter for screening for malignant neoplasm of prostate: Secondary | ICD-10-CM | POA: Diagnosis not present

## 2019-03-10 DIAGNOSIS — F329 Major depressive disorder, single episode, unspecified: Secondary | ICD-10-CM

## 2019-03-10 DIAGNOSIS — Z114 Encounter for screening for human immunodeficiency virus [HIV]: Secondary | ICD-10-CM

## 2019-03-10 DIAGNOSIS — E785 Hyperlipidemia, unspecified: Secondary | ICD-10-CM

## 2019-03-10 DIAGNOSIS — I10 Essential (primary) hypertension: Secondary | ICD-10-CM

## 2019-03-10 DIAGNOSIS — F419 Anxiety disorder, unspecified: Secondary | ICD-10-CM | POA: Diagnosis not present

## 2019-03-10 DIAGNOSIS — Z1159 Encounter for screening for other viral diseases: Secondary | ICD-10-CM

## 2019-03-10 DIAGNOSIS — Z1211 Encounter for screening for malignant neoplasm of colon: Secondary | ICD-10-CM

## 2019-03-10 DIAGNOSIS — Z0000 Encounter for general adult medical examination without abnormal findings: Secondary | ICD-10-CM

## 2019-03-10 DIAGNOSIS — F32A Depression, unspecified: Secondary | ICD-10-CM

## 2019-03-10 LAB — CBC
HCT: 34.2 % — ABNORMAL LOW (ref 39.0–52.0)
Hemoglobin: 10.7 g/dL — ABNORMAL LOW (ref 13.0–17.0)
MCHC: 31.3 g/dL (ref 30.0–36.0)
MCV: 77 fl — ABNORMAL LOW (ref 78.0–100.0)
Platelets: 405 10*3/uL — ABNORMAL HIGH (ref 150.0–400.0)
RBC: 4.44 Mil/uL (ref 4.22–5.81)
RDW: 17 % — ABNORMAL HIGH (ref 11.5–15.5)
WBC: 7.6 10*3/uL (ref 4.0–10.5)

## 2019-03-10 LAB — COMPREHENSIVE METABOLIC PANEL
ALT: 40 U/L (ref 0–53)
AST: 45 U/L — ABNORMAL HIGH (ref 0–37)
Albumin: 4.9 g/dL (ref 3.5–5.2)
Alkaline Phosphatase: 90 U/L (ref 39–117)
BUN: 17 mg/dL (ref 6–23)
CO2: 29 mEq/L (ref 19–32)
Calcium: 9.7 mg/dL (ref 8.4–10.5)
Chloride: 97 mEq/L (ref 96–112)
Creatinine, Ser: 0.88 mg/dL (ref 0.40–1.50)
GFR: 89.77 mL/min (ref >60.00)
Glucose, Bld: 119 mg/dL — ABNORMAL HIGH (ref 70–99)
Potassium: 4.5 mEq/L (ref 3.5–5.1)
Sodium: 135 mEq/L (ref 135–145)
Total Bilirubin: 0.6 mg/dL (ref 0.2–1.2)
Total Protein: 7.6 g/dL (ref 6.0–8.3)

## 2019-03-10 LAB — PSA: PSA: 0.54 ng/mL (ref 0.10–4.00)

## 2019-03-10 LAB — LIPID PANEL
Cholesterol: 251 mg/dL — ABNORMAL HIGH (ref 0–200)
HDL: 52 mg/dL (ref >39.00)
NonHDL: 198.83
Total CHOL/HDL Ratio: 5
Triglycerides: 242 mg/dL — ABNORMAL HIGH (ref 0.0–149.0)
VLDL: 48.4 mg/dL — ABNORMAL HIGH (ref 0.0–40.0)

## 2019-03-10 LAB — LDL CHOLESTEROL, DIRECT: Direct LDL: 162 mg/dL

## 2019-03-10 MED ORDER — LEVOCETIRIZINE DIHYDROCHLORIDE 5 MG PO TABS: 5.0000 mg | ORAL_TABLET | Freq: Every evening | ORAL | 2 refills | Status: DC

## 2019-03-10 MED ORDER — LISINOPRIL-HYDROCHLOROTHIAZIDE 20-12.5 MG PO TABS: 1.0000 | ORAL_TABLET | Freq: Two times a day (BID) | ORAL | 11 refills | Status: DC

## 2019-03-10 MED ORDER — ROSUVASTATIN CALCIUM 20 MG PO TABS: 20.0000 mg | ORAL_TABLET | Freq: Every day | ORAL | 11 refills | Status: DC

## 2019-03-10 MED ORDER — FLUTICASONE PROPIONATE 50 MCG/ACT NA SUSP: 2.0000 | Freq: Every day | NASAL | 2 refills | Status: DC

## 2019-03-10 NOTE — Patient Instructions (Addendum)
Give [place] 2-3 business days to get the results of your labs back.   Keep the diet clean and stay active.  Consider meal prepping.  If you do not hear anything about your referral in the next 1-2 weeks, call our office and ask for an update.  The new Shingrix vaccine (for shingles) is a 2 shot series. It can make people feel low energy, achy and almost like they have the flu for 48 hours after injection. Please plan accordingly when deciding on when to get this shot. Call our office for a nurse visit appointment to get this. The second shot of the series is less severe regarding the side effects, but it still lasts 48 hours.   Claritin (loratadine), Allegra (fexofenadine), Zyrtec (cetirizine) which is also equivalent to Xyzal (levocetirizine); these are listed in order from weakest to strongest. Generic, and therefore cheaper, options are in the parentheses.   Flonase (fluticasone); nasal spray that is over the counter. 2 sprays each nostril, once daily. Aim towards the same side eye when you spray.  There are available OTC, and the generic versions, which may be cheaper, are in parentheses. Show this to a pharmacist if you have trouble finding any of these items.  Let us know if you need anything.

## 2019-03-10 NOTE — Progress Notes (Signed)
CC: Well visit  Well Male William Shea is here for a complete physical.   His last physical was >1 year ago.  Current diet: in general, a "healthy" diet.  Current exercise: active at work Weight trend: increased a little Daytime fatigue? No. Seat belt? Yes.    Reports breathing is better overall.  He will have drainage in the morning and sometimes it will cause him to cough.  Some intermittent wheezing.  He did not pick up the inhalers yet.  Health maintenance Shingrix- No Colonoscopy- No Tetanus- Yes HIV- No Hep C- No Prostate cancer screening- No   Past Medical History:  Diagnosis Date  . Allergy    seasonal  . High cholesterol   . Hypertension   . Sinusitis       Past Surgical History:  Procedure Laterality Date  . Unremarkable      Medications  Current Outpatient Medications on File Prior to Visit  Medication Sig Dispense Refill  . albuterol (VENTOLIN HFA) 108 (90 Base) MCG/ACT inhaler Inhale 2 puffs into the lungs every 6 (six) hours as needed for wheezing or shortness of breath. 18 g 1  . escitalopram (LEXAPRO) 5 MG tablet TAKE ONE TABLET BY MOUTH ONE TIME DAILY 30 tablet 3  . hydrOXYzine (ATARAX/VISTARIL) 25 MG tablet Take 1 tablet (25 mg total) by mouth 3 (three) times daily as needed. 30 tablet 1  . ibuprofen (ADVIL,MOTRIN) 200 MG tablet Take 200 mg by mouth every 6 (six) hours as needed.    . Multiple Vitamins-Minerals (CENTRUM SILVER ADULT 50+) TABS Take 1 tablet by mouth daily.     Allergies No Known Allergies  Family History Family History  Problem Relation Age of Onset  . Hypertension Mother        Living  . Thyroid disease Mother   . Diabetes Father 16       Deceased  . Obesity Father   . Heart attack Father   . Diabetes Other        Paternal Side of Family  . Alzheimer's disease Maternal Grandmother   . Diabetes Sister   . Healthy Son        x3  . Colon cancer Neg Hx   . Rectal cancer Neg Hx   . Stomach cancer Neg Hx     Review of  Systems: Constitutional:  no fevers Eye:  no recent significant change in vision Ear/Nose/Mouth/Throat:  Ears:  no hearing loss Nose/Mouth/Throat:  no complaints of nasal congestion, + drainage in the mornings, no sore throat Cardiovascular:  no chest pain, no palpitations Respiratory:  no current cough and no shortness of breath Gastrointestinal:  no abdominal pain, no change in bowel habits GU:  Male: negative for dysuria, frequency, and incontinence and negative for prostate symptoms Musculoskeletal/Extremities:  no pain, redness, or swelling of the joints Integumentary (Skin/Breast):  no abnormal skin lesions reported Neurologic:  no headaches Endocrine: No unexpected weight changes Hematologic/Lymphatic:  no abnormal bleeding  Exam BP 102/72 (BP Location: Left Arm, Patient Position: Sitting, Cuff Size: Normal)   Pulse 72   Temp 97.6 F (36.4 C) (Temporal)   Resp 18   Ht 6' (1.829 m)   Wt 209 lb 6.4 oz (95 kg)   SpO2 98%   BMI 28.40 kg/m  General:  well developed, well nourished, in no apparent distress Skin:  no significant moles, warts, or growths Head:  no masses, lesions, or tenderness Eyes:  pupils equal and round, sclera anicteric without injection Ears:  canals without lesions, TMs shiny without retraction, no obvious effusion, no erythema Nose:  nares patent, septum midline, mucosa normal Throat/Pharynx:  lips and gingiva without lesion; tongue and uvula midline; non-inflamed pharynx; no exudates or postnasal drainage Neck: neck supple without adenopathy, thyromegaly, or masses Cardiac: RRR, no bruits, no LE edema Lungs:  clear to auscultation, breath sounds equal bilaterally, no respiratory distress Rectal: Deferred Musculoskeletal:  symmetrical muscle groups noted without atrophy or deformity Neuro:  gait normal; deep tendon reflexes normal and symmetric Psych: well oriented with normal range of affect and appropriate judgment/insight  Assessment and  Plan  Well adult exam - Plan: EKG 12-Lead, Lipid Profile, CBC, Comp Met (CMET)  Anxiety and depression  Essential hypertension - Plan: lisinopril-hydrochlorothiazide (ZESTORETIC) 20-12.5 MG tablet  Hyperlipidemia, unspecified hyperlipidemia type - Plan: rosuvastatin (CRESTOR) 20 MG tablet  Screen for colon cancer - Plan: Ambulatory referral to Gastroenterology  Screening for prostate cancer - Plan: PSA  Encounter for hepatitis C screening test for low risk patient - Plan: Hepatitis C antibody  Screening for HIV (human immunodeficiency virus) - Plan: HIV antibody (with reflex)   Well 53 y.o. male. Counseled on diet and exercise. Counseled on risks and benefits of prostate cancer screening with PSA. The patient agrees to undergo testing. Immunizations, labs, and further orders as above. EKG today.  It shows normal sinus rhythm with a regular axis, no interval abnormalities, no signs of ischemia, good R wave progression. Follow up in 6 mo or prn. The patient voiced understanding and agreement to the plan.  [place], DO 03/10/19 12:03 PM

## 2019-03-13 ENCOUNTER — Other Ambulatory Visit: Payer: Self-pay | Admitting: Family Medicine

## 2019-03-13 DIAGNOSIS — R7309 Other abnormal glucose: Secondary | ICD-10-CM

## 2019-03-13 DIAGNOSIS — E785 Hyperlipidemia, unspecified: Secondary | ICD-10-CM

## 2019-03-13 LAB — HEPATITIS C ANTIBODY
Hepatitis C Ab: NONREACTIVE
SIGNAL TO CUT-OFF: 0.01 (ref <1.00)

## 2019-03-13 LAB — HIV ANTIBODY (ROUTINE TESTING W REFLEX): HIV 1&2 Ab, 4th Generation: NONREACTIVE

## 2019-03-13 MED ORDER — FENOFIBRATE 54 MG PO TABS: 54.0000 mg | ORAL_TABLET | Freq: Every day | ORAL | 3 refills | Status: DC

## 2019-04-12 ENCOUNTER — Encounter: Payer: Self-pay | Admitting: Family Medicine

## 2019-04-13 ENCOUNTER — Other Ambulatory Visit: Payer: Self-pay

## 2019-04-13 ENCOUNTER — Other Ambulatory Visit (INDEPENDENT_AMBULATORY_CARE_PROVIDER_SITE_OTHER): Payer: No Typology Code available for payment source

## 2019-04-13 DIAGNOSIS — R7309 Other abnormal glucose: Secondary | ICD-10-CM | POA: Diagnosis not present

## 2019-04-13 DIAGNOSIS — E785 Hyperlipidemia, unspecified: Secondary | ICD-10-CM

## 2019-04-13 LAB — COMPREHENSIVE METABOLIC PANEL
ALT: 26 U/L (ref 0–53)
AST: 32 U/L (ref 0–37)
Albumin: 4.5 g/dL (ref 3.5–5.2)
Alkaline Phosphatase: 80 U/L (ref 39–117)
BUN: 17 mg/dL (ref 6–23)
CO2: 28 mEq/L (ref 19–32)
Calcium: 9.1 mg/dL (ref 8.4–10.5)
Chloride: 98 mEq/L (ref 96–112)
Creatinine, Ser: 0.83 mg/dL (ref 0.40–1.50)
GFR: 96 mL/min (ref >60.00)
Glucose, Bld: 127 mg/dL — ABNORMAL HIGH (ref 70–99)
Potassium: 4.2 mEq/L (ref 3.5–5.1)
Sodium: 135 mEq/L (ref 135–145)
Total Bilirubin: 0.4 mg/dL (ref 0.2–1.2)
Total Protein: 7.1 g/dL (ref 6.0–8.3)

## 2019-04-13 LAB — LIPID PANEL
Cholesterol: 230 mg/dL — ABNORMAL HIGH (ref 0–200)
HDL: 53.9 mg/dL (ref >39.00)
NonHDL: 175.61
Total CHOL/HDL Ratio: 4
Triglycerides: 227 mg/dL — ABNORMAL HIGH (ref 0.0–149.0)
VLDL: 45.4 mg/dL — ABNORMAL HIGH (ref 0.0–40.0)

## 2019-04-13 LAB — LDL CHOLESTEROL, DIRECT: Direct LDL: 146 mg/dL

## 2019-04-13 LAB — HEMOGLOBIN A1C: Hgb A1c MFr Bld: 6.4 % (ref 4.6–6.5)

## 2019-04-14 ENCOUNTER — Encounter: Payer: Self-pay | Admitting: Family Medicine

## 2019-04-17 ENCOUNTER — Other Ambulatory Visit: Payer: Self-pay | Admitting: Family Medicine

## 2019-04-17 DIAGNOSIS — F329 Major depressive disorder, single episode, unspecified: Secondary | ICD-10-CM

## 2019-04-17 DIAGNOSIS — F32A Depression, unspecified: Secondary | ICD-10-CM

## 2019-04-17 DIAGNOSIS — F419 Anxiety disorder, unspecified: Secondary | ICD-10-CM

## 2019-04-19 ENCOUNTER — Encounter: Payer: Self-pay | Admitting: Family Medicine

## 2019-04-19 ENCOUNTER — Telehealth: Payer: Self-pay | Admitting: Family Medicine

## 2019-04-19 ENCOUNTER — Encounter: Payer: Self-pay | Admitting: Gastroenterology

## 2019-04-19 NOTE — Telephone Encounter (Signed)
Copied from [place] [id number]. Topic: General - Other >> Apr 19, 2019  9:57 AM Yvette Rack wrote: Reason for CRM: Pt stated he was returning call to [place]. Cb# [phone]   Called the patient informed that we had sent a letter out today to remind that if interested still in getting a Colonoscopy to call [facility] to schedule. He had already gotten the number and is scheduled.

## 2019-04-27 ENCOUNTER — Other Ambulatory Visit: Payer: Self-pay

## 2019-04-27 ENCOUNTER — Ambulatory Visit (AMBULATORY_SURGERY_CENTER): Payer: Self-pay | Admitting: *Deleted

## 2019-04-27 VITALS — Temp 96.8°F | Ht 72.0 in | Wt 217.0 lb

## 2019-04-27 DIAGNOSIS — Z01818 Encounter for other preprocedural examination: Secondary | ICD-10-CM

## 2019-04-27 DIAGNOSIS — Z1211 Encounter for screening for malignant neoplasm of colon: Secondary | ICD-10-CM

## 2019-04-27 MED ORDER — NA SULFATE-K SULFATE-MG SULF 17.5-3.13-1.6 GM/177ML PO SOLN: ORAL | 0 refills | Status: DC

## 2019-04-27 NOTE — Progress Notes (Signed)
Patient is here in-person for PV. Patient denies any allergies to eggs or soy. Patient denies any problems with anesthesia/sedation. Patient denies any oxygen use at home. Patient denies taking any diet/weight loss medications or blood thinners. Patient is not being treated for MRSA or C-diff. EMMI education assisgned to the patient for the procedure, this was explained and instructions given to patient. COVID-19 screening test is on 2/2, the pt is aware. Pt is aware that care partner will wait in the car during procedure; if they feel like they will be too hot or cold to wait in the car; they may wait in the 4 th floor lobby. Patient is aware to bring only one care partner. We want them to wear a mask (we do not have any that we can provide them), practice social distancing, and we will check their temperatures when they get here.  I did remind the patient that their care partner needs to stay in the parking lot the entire time and have a cell phone available, we will call them when the pt is ready for discharge. Patient will wear mask into building.    Suprep $15 off coupon given to the patient.

## 2019-05-05 ENCOUNTER — Encounter: Payer: Self-pay | Admitting: Gastroenterology

## 2019-05-09 ENCOUNTER — Other Ambulatory Visit: Payer: Self-pay | Admitting: Gastroenterology

## 2019-05-09 ENCOUNTER — Ambulatory Visit (INDEPENDENT_AMBULATORY_CARE_PROVIDER_SITE_OTHER): Payer: No Typology Code available for payment source

## 2019-05-09 DIAGNOSIS — Z1159 Encounter for screening for other viral diseases: Secondary | ICD-10-CM

## 2019-05-09 LAB — SARS CORONAVIRUS 2 (TAT 6-24 HRS): SARS Coronavirus 2: NEGATIVE

## 2019-05-11 ENCOUNTER — Telehealth: Payer: Self-pay | Admitting: Family Medicine

## 2019-05-11 DIAGNOSIS — I10 Essential (primary) hypertension: Secondary | ICD-10-CM

## 2019-05-11 NOTE — Telephone Encounter (Signed)
Per patient's pharmacy [facility] on [address].   Patient's medication :lisinopril-hydrochlorothiazide (ZESTORETIC) 20-12.5 MG tablet  Has be increased to taking the 2 pills  2 times a day making the quanity used 60pills per months and has reduced his refills from 11 to 4 . Patient needs aproval to get medication refilled per Orlando [facility]ner at [facility], [place],  [address]   [phone]

## 2019-05-12 ENCOUNTER — Encounter: Payer: Self-pay | Admitting: Gastroenterology

## 2019-05-12 ENCOUNTER — Ambulatory Visit (AMBULATORY_SURGERY_CENTER): Payer: No Typology Code available for payment source | Admitting: Gastroenterology

## 2019-05-12 ENCOUNTER — Other Ambulatory Visit: Payer: Self-pay

## 2019-05-12 VITALS — BP 102/74 | HR 63 | Temp 97.5°F | Resp 15 | Ht 72.0 in | Wt 217.0 lb

## 2019-05-12 DIAGNOSIS — Z1211 Encounter for screening for malignant neoplasm of colon: Secondary | ICD-10-CM | POA: Diagnosis present

## 2019-05-12 DIAGNOSIS — D120 Benign neoplasm of cecum: Secondary | ICD-10-CM | POA: Diagnosis not present

## 2019-05-12 MED ORDER — SODIUM CHLORIDE 0.9 % IV SOLN: 500.0000 mL | Freq: Once | INTRAVENOUS | Status: DC

## 2019-05-12 NOTE — Progress Notes (Signed)
Pt's states no medical or surgical changes since previsit or office visit. 

## 2019-05-12 NOTE — Op Note (Signed)
[facility] Patient Name: William Shea Procedure Date: 05/12/2019 11:12 AM MRN: [id number] Endoscopist: Mauri Pole , MD Age: 54 Referring MD:  Date of Birth: 1966/03/12 Gender: Male Account #: [id number] Procedure:                Colonoscopy Indications:              Screening for colorectal malignant neoplasm Medicines:                Monitored Anesthesia Care Procedure:                Pre-Anesthesia Assessment:                           - Prior to the procedure, a History and Physical                            was performed, and patient medications and                            allergies were reviewed. The patient's tolerance of                            previous anesthesia was also reviewed. The risks                            and benefits of the procedure and the sedation                            options and risks were discussed with the patient.                            All questions were answered, and informed consent                            was obtained. Prior Anticoagulants: The patient has                            taken no previous anticoagulant or antiplatelet                            agents. ASA Grade Assessment: II - A patient with                            mild systemic disease. After reviewing the risks                            and benefits, the patient was deemed in                            satisfactory condition to undergo the procedure.                           After obtaining informed consent, the colonoscope  was passed under direct vision. Throughout the                            procedure, the patient's blood pressure, pulse, and                            oxygen saturations were monitored continuously. The                            Colonoscope was introduced through the anus and                            advanced to the the cecum, identified by                            appendiceal orifice and  ileocecal valve. The                            quality of the bowel preparation was excellent. The                            ileocecal valve, appendiceal orifice, and rectum                            were photographed. Scope In: 11:16:36 AM Scope Out: 11:30:49 AM Scope Withdrawal Time: 0 hours 11 minutes 33 seconds  Total Procedure Duration: 0 hours 14 minutes 13 seconds  Findings:                 The perianal and digital rectal examinations were                            normal.                           A 3 mm polyp was found in the cecum. The polyp was                            sessile. The polyp was removed with a cold snare.                            Resection and retrieval were complete.                           A few small-mouthed diverticula were found in the                            sigmoid colon.                           Non-bleeding internal hemorrhoids were found during                            retroflexion. The hemorrhoids were medium-sized.  The exam was otherwise without abnormality. Complications:            No immediate complications. Estimated Blood Loss:     Estimated blood loss was minimal. Impression:               - One 3 mm polyp in the cecum, removed with a cold                            snare. Resected and retrieved.                           - Diverticulosis in the sigmoid colon.                           - Non-bleeding internal hemorrhoids.                           - The examination was otherwise normal. Recommendation:           - Patient has a contact number available for                            emergencies. The signs and symptoms of potential                            delayed complications were discussed with the                            patient. Return to normal activities tomorrow.                            Written discharge instructions were provided to the                            patient.                            - Resume previous diet.                           - Continue present medications.                           - Await pathology results.                           - Repeat colonoscopy in 5-10 years for surveillance                            based on pathology results. Mauri Pole, MD 05/12/2019 11:35:15 AM This report has been signed electronically.

## 2019-05-12 NOTE — Progress Notes (Signed)
Called to room to assist during endoscopic procedure.  Patient ID and intended procedure confirmed with present staff. Received instructions for my participation in the procedure from the performing physician.  

## 2019-05-12 NOTE — Patient Instructions (Signed)
Handouts given for polyps, diverticulosis, hemorrhoids and hemorrhoid banding.  YOU HAD AN ENDOSCOPIC PROCEDURE TODAY AT [facility]:   Refer to the procedure report that was given to you for any specific questions about what was found during the examination.  If the procedure report does not answer your questions, please call your gastroenterologist to clarify.  If you requested that your care partner not be given the details of your procedure findings, then the procedure report has been included in a sealed envelope for you to review at your convenience later.  YOU SHOULD EXPECT: Some feelings of bloating in the abdomen. Passage of more gas than usual.  Walking can help get rid of the air that was put into your GI tract during the procedure and reduce the bloating. If you had a lower endoscopy (such as a colonoscopy or flexible sigmoidoscopy) you may notice spotting of blood in your stool or on the toilet paper. If you underwent a bowel prep for your procedure, you may not have a normal bowel movement for a few days.  Please Note:  You might notice some irritation and congestion in your nose or some drainage.  This is from the oxygen used during your procedure.  There is no need for concern and it should clear up in a day or so.  SYMPTOMS TO REPORT IMMEDIATELY:   Following lower endoscopy (colonoscopy or flexible sigmoidoscopy):  Excessive amounts of blood in the stool  Significant tenderness or worsening of abdominal pains  Swelling of the abdomen that is new, acute  Fever of 100F or higher  For urgent or emergent issues, a gastroenterologist can be reached at any hour by calling [phone].   DIET:  We do recommend a small meal at first, but then you may proceed to your regular diet.  Drink plenty of fluids but you should avoid alcoholic beverages for 24 hours.  ACTIVITY:  You should plan to take it easy for the rest of today and you should NOT DRIVE or use heavy  machinery until tomorrow (because of the sedation medicines used during the test).    FOLLOW UP: Our staff will call the number listed on your records 48-72 hours following your procedure to check on you and address any questions or concerns that you may have regarding the information given to you following your procedure. If we do not reach you, we will leave a message.  We will attempt to reach you two times.  During this call, we will ask if you have developed any symptoms of COVID 19. If you develop any symptoms (ie: fever, flu-like symptoms, shortness of breath, cough etc.) before then, please call [phone].  If you test positive for Covid 19 in the 2 weeks post procedure, please call and report this information to [place].    If any biopsies were taken you will be contacted by phone or by letter within the next 1-3 weeks.  Please call us at [phone] if you have not heard about the biopsies in 3 weeks.    SIGNATURES/CONFIDENTIALITY: You and/or your care partner have signed paperwork which will be entered into your electronic medical record.  These signatures attest to the fact that that the information above on your After Visit Summary has been reviewed and is understood.  Full responsibility of the confidentiality of this discharge information lies with you and/or your care-partner.

## 2019-05-12 NOTE — Progress Notes (Signed)
Pt tolerated well. VSS. Awake and to recovery. 

## 2019-05-15 MED ORDER — LISINOPRIL-HYDROCHLOROTHIAZIDE 20-12.5 MG PO TABS: 1.0000 | ORAL_TABLET | Freq: Two times a day (BID) | ORAL | 11 refills | Status: DC

## 2019-05-15 NOTE — Telephone Encounter (Signed)
He is taking 2 pills daily or 2 pills twice daily? If he is taking 2 total pills daily, OK to send in 90 or 30 d supplies with requested refills. If not, we need to get in touch. Ty.

## 2019-05-15 NOTE — Telephone Encounter (Signed)
Refill done.  

## 2019-05-15 NOTE — Addendum Note (Signed)
Addended by: Sharon Seller B on: 05/15/2019 11:46 AM   Modules accepted: Orders

## 2019-05-16 ENCOUNTER — Telehealth: Payer: Self-pay

## 2019-05-16 ENCOUNTER — Telehealth: Payer: Self-pay | Admitting: *Deleted

## 2019-05-16 NOTE — Telephone Encounter (Signed)
-----   Message from Mauri Pole, MD sent at 05/12/2019  1:00 PM EST ----- He needs hemorrhoidal band ligation appointment, please schedule next available .  Thanks

## 2019-05-16 NOTE — Telephone Encounter (Signed)
  Follow up Call-  Call back number 05/12/2019  Post procedure Call Back phone  # [phone]  Permission to leave phone message Yes  Some recent data might be hidden     Patient questions:  Do you have a fever, pain , or abdominal swelling? No. Pain Score  0 *  Have you tolerated food without any problems? Yes.    Have you been able to return to your normal activities? Yes.    Do you have any questions about your discharge instructions: Diet   No. Medications  No. Follow up visit  No.  Do you have questions or concerns about your Care? No.  Actions: * If pain score is 4 or above: No action needed, pain <4.  1. Have you developed a fever since your procedure? no  2.   Have you had an respiratory symptoms (SOB or cough) since your procedure? no  3.   Have you tested positive for COVID 19 since your procedure no  4.   Have you had any family members/close contacts diagnosed with the COVID 19 since your procedure?  no   If yes to any of these questions please route to Joylene John, RN and Alphonsa Gin, Therapist, sports.

## 2019-05-16 NOTE — Telephone Encounter (Signed)
Called the patient. No answer. Left a message that I am calling to help him schedule his first hemorrhoid banding procedure with Dr Silverio Decamp. Ask that he call back and ask to be scheduled.

## 2019-05-16 NOTE — Telephone Encounter (Signed)
Left message on f/u call 

## 2019-05-18 ENCOUNTER — Encounter: Payer: Self-pay | Admitting: Gastroenterology

## 2019-05-28 ENCOUNTER — Other Ambulatory Visit: Payer: Self-pay | Admitting: Family Medicine

## 2019-05-28 DIAGNOSIS — E785 Hyperlipidemia, unspecified: Secondary | ICD-10-CM

## 2019-07-18 ENCOUNTER — Encounter: Payer: Self-pay | Admitting: Family Medicine

## 2019-07-18 ENCOUNTER — Other Ambulatory Visit: Payer: Self-pay

## 2019-07-18 ENCOUNTER — Telehealth (INDEPENDENT_AMBULATORY_CARE_PROVIDER_SITE_OTHER): Payer: No Typology Code available for payment source | Admitting: Family Medicine

## 2019-07-18 DIAGNOSIS — J4540 Moderate persistent asthma, uncomplicated: Secondary | ICD-10-CM | POA: Diagnosis not present

## 2019-07-18 MED ORDER — FLOVENT HFA 110 MCG/ACT IN AERO: 2.0000 | INHALATION_SPRAY | Freq: Two times a day (BID) | RESPIRATORY_TRACT | 12 refills | Status: DC

## 2019-07-18 NOTE — Progress Notes (Signed)
Chief Complaint  Patient presents with  . Shortness of Breath    on exertion    Subjective: Patient is a 54 y.o. male here for dyspnea. Due to COVID-19 pandemic, we are interacting via web portal for an electronic face-to-face visit. I verified patient's ID using 2 identifiers. Patient agreed to proceed with visit via this method. Patient is in car, I am at office. Patient and I are present for visit.   Over past few weeks, has been having severe sob when he exerts himself with higher intensity (not while walking, but if he runs up the stairs). It takes him around 1 min to catch his breath. No CP or pressure. Denies arm/jaw pain. Albuterol has helped his chest pain/pressure from prior, but he has not used it since then. No wheezing or coughing while it happens. He does have a hx of allergies.   Past Medical History:  Diagnosis Date  . Allergy    seasonal  . High cholesterol   . Hypertension     Objective: No conversational dyspnea Age appropriate judgment and insight Nml affect and mood  Assessment and Plan: Moderate persistent extrinsic asthma without complication - Plan: fluticasone (FLOVENT HFA) 110 MCG/ACT inhaler  Sounds like allergy induced asthma. Consider INCS + Xyzal/PO antihistamine. Add ICS. SABA prn. Does not sound cardiac.  F/u in 6 weeks to reck.  The patient voiced understanding and agreement to the plan.  [place], DO 07/18/19  2:31 PM

## 2019-07-26 ENCOUNTER — Telehealth: Payer: Self-pay | Admitting: Family Medicine

## 2019-07-26 NOTE — Telephone Encounter (Signed)
Called and scheduled appt with PCP for Friday 07/28/19 at 2 PM

## 2019-07-26 NOTE — Telephone Encounter (Signed)
While I am happy to refer, I would like to see him in the clinic first to discuss the next steps. I would like to consider an X-ray and listen to his lungs. Ty.

## 2019-07-26 NOTE — Telephone Encounter (Signed)
Pt states he's still having trouble breathing. He wanted to know if he should do a follow up with [facility] or if he should be  Referred out to cardiologist. pls advise

## 2019-07-28 ENCOUNTER — Other Ambulatory Visit: Payer: Self-pay

## 2019-07-28 ENCOUNTER — Ambulatory Visit: Payer: No Typology Code available for payment source | Admitting: Family Medicine

## 2019-07-28 ENCOUNTER — Encounter: Payer: Self-pay | Admitting: Family Medicine

## 2019-07-28 VITALS — BP 108/62 | HR 86 | Temp 95.4°F | Ht 71.0 in | Wt 213.4 lb

## 2019-07-28 DIAGNOSIS — R0600 Dyspnea, unspecified: Secondary | ICD-10-CM

## 2019-07-28 DIAGNOSIS — R0609 Other forms of dyspnea: Secondary | ICD-10-CM

## 2019-07-28 DIAGNOSIS — R55 Syncope and collapse: Secondary | ICD-10-CM | POA: Diagnosis not present

## 2019-07-28 NOTE — Patient Instructions (Addendum)
Send me a message in 10 days if not improving and we will set you up with a cardiologist.  Let me know if there are cost issues with the inhaler.   Let us know if you need anything.

## 2019-07-28 NOTE — Progress Notes (Signed)
Chief Complaint  Patient presents with  . Breathing Problem    Subjective: Patient is a 54 y.o. male here for f/u breathing issue.  6 d ago was doing yard work. Cut down a branch and had extreme fatigue, dizziness and sob. He went inside and sat down to gather himself. No chest pain/tightness, wheezing, cough, fevers, syncope. He did not get the ICS yet. He continues to use SABA with benefit, but forgets to use it prophylactically before expected exertion, which still causes sob. Dad had MI at 55 but was more out of shape and had diabetes. Pt does not smoke.   Past Medical History:  Diagnosis Date  . Allergy    seasonal  . High cholesterol   . Hypertension     Objective: BP 108/62 (BP Location: Left Arm, Patient Position: Sitting, Cuff Size: Normal)   Pulse 86   Temp (!) 95.4 F (35.2 C) (Temporal)   Ht 5\' 11"  (1.803 m)   Wt 213 lb 6 oz (96.8 kg)   SpO2 98%   BMI 29.76 kg/m  General: Awake, appears stated age HEENT: MMM, EOMi Heart: RRR, no bruits, no LE edema Lungs: CTAB, no rales, wheezes or rhonchi. No accessory muscle use Psych: Age appropriate judgment and insight, normal affect and mood  Assessment and Plan: DOE (dyspnea on exertion)  Pre-syncope  I think we should see how things go regarding his breathing first as he has responded well to SABA and start the ICS. He will let us know how things are going in 10 d. If no better, will refer to cardiology (I did offer to refer him today if it would give him better peace of mind, but he agreed to the above plan).  The patient voiced understanding and agreement to the plan.  [place], DO 07/28/19  3:23 PM

## 2019-08-10 ENCOUNTER — Telehealth: Payer: Self-pay | Admitting: Family Medicine

## 2019-08-10 DIAGNOSIS — R0609 Other forms of dyspnea: Secondary | ICD-10-CM

## 2019-08-10 NOTE — Telephone Encounter (Signed)
Pt states he's starting to have daily chest pains. [facility] told him that if this happens he would refer him to a cardiologist. Patient would now like the referral sent. Please Advise

## 2019-08-10 NOTE — Telephone Encounter (Signed)
Cardio referral ordered.

## 2019-08-18 ENCOUNTER — Ambulatory Visit (INDEPENDENT_AMBULATORY_CARE_PROVIDER_SITE_OTHER): Payer: No Typology Code available for payment source | Admitting: Cardiology

## 2019-08-18 ENCOUNTER — Telehealth: Payer: Self-pay | Admitting: Emergency Medicine

## 2019-08-18 ENCOUNTER — Encounter: Payer: Self-pay | Admitting: Cardiology

## 2019-08-18 ENCOUNTER — Other Ambulatory Visit: Payer: Self-pay

## 2019-08-18 VITALS — BP 130/80 | HR 70 | Ht 72.0 in | Wt 216.0 lb

## 2019-08-18 DIAGNOSIS — E782 Mixed hyperlipidemia: Secondary | ICD-10-CM

## 2019-08-18 DIAGNOSIS — I209 Angina pectoris, unspecified: Secondary | ICD-10-CM

## 2019-08-18 DIAGNOSIS — E785 Hyperlipidemia, unspecified: Secondary | ICD-10-CM | POA: Diagnosis not present

## 2019-08-18 DIAGNOSIS — I10 Essential (primary) hypertension: Secondary | ICD-10-CM

## 2019-08-18 HISTORY — DX: Angina pectoris, unspecified: I20.9

## 2019-08-18 MED ORDER — NITROGLYCERIN 0.4 MG SL SUBL: 0.4000 mg | SUBLINGUAL_TABLET | SUBLINGUAL | 11 refills | Status: AC | PRN

## 2019-08-18 MED ORDER — ROSUVASTATIN CALCIUM 40 MG PO TABS: 40.0000 mg | ORAL_TABLET | Freq: Every day | ORAL | 2 refills | Status: DC

## 2019-08-18 MED ORDER — ASPIRIN EC 81 MG PO TBEC
81.0000 mg | DELAYED_RELEASE_TABLET | Freq: Every day | ORAL | 3 refills | Status: DC
Start: 2019-08-18 — End: 2022-06-18

## 2019-08-18 MED ORDER — METOPROLOL TARTRATE 25 MG PO TABS
25.0000 mg | ORAL_TABLET | Freq: Two times a day (BID) | ORAL | 2 refills | Status: DC
Start: 2019-08-18 — End: 2019-10-18

## 2019-08-18 NOTE — Telephone Encounter (Signed)
Called patient and informed him of cath date and time along with avs instructions since he left appointment early to get to work. Instructions were sent via mychart as well patient advised to call me with any further questions.

## 2019-08-18 NOTE — Patient Instructions (Addendum)
Medication Instructions:  Your physician has recommended you make the following change in your medication:   START: Aspirin 81 mg daily   START: Metoprolol tartrate 25 mg twice daily   INCREASE: Crestor to 40mg  daily   Take as needed for chest pain: Nitroglycerin 0.4 mg sublingual (under your tongue) as needed for chest pain. If experiencing chest pain, stop what you are doing and sit down. Take 1 nitroglycerin and wait 5 minutes. If chest pain continues, take another nitroglycerin and wait 5 minutes. If chest pain does not subside, take 1 more nitroglycerin and dial 911. You make take a total of 3 nitroglycerin in a 15 minute time frame.  *If you need a refill on your cardiac medications before your next appointment, please call your pharmacy*   Lab Work: Your physician recommends that you return for lab work today: bmp, cbc   If you have labs (blood work) drawn today and your tests are completely normal, you will receive your results only by: Marland Kitchen MyChart Message (if you have MyChart) OR . A paper copy in the mail If you have any lab test that is abnormal or we need to change your treatment, we will call you to review the results.   Testing/Procedures: A chest x-ray takes a picture of the organs and structures inside the chest, including the heart, lungs, and blood vessels. This test can show several things, including, whether the heart is enlarges; whether fluid is building up in the lungs; and whether pacemaker / defibrillator leads are still in place.     [facility] [place], [place] [address] Dept: [phone] Loc: [phone]  William Shea  08/18/2019  You are scheduled for a Cardiac Catheterization on Wednesday, May 26 with Dr. Glenetta Hew.  1. Please arrive at the [facility] (Main Entrance A) at [facility]: [address] [place], [place] [address] at 6:30  AM (This time is two hours before your procedure to ensure your preparation). Free valet parking service is available.   Special note: Every effort is made to have your procedure done on time. Please understand that emergencies sometimes delay scheduled procedures.  2. Diet: Do not eat solid foods after midnight.  The patient may have clear liquids until 5am upon the day of the procedure.  3. Labs: You will need to have labs drawn.  4. Medication instructions in preparation for your procedure:    HOLD THE DAY OF THE PROCEDURE: lisinopril-hydrochlorothiazide   On the morning of your procedure, take your Aspirin and any morning medicines NOT listed above.  You may use sips of water.  5. Plan for one night stay--bring personal belongings. 6. Bring a current list of your medications and current insurance cards. 7. You MUST have a responsible person to drive you home. 8. Someone MUST be with you the first 24 hours after you arrive home or your discharge will be delayed. 9. Please wear clothes that are easy to get on and off and wear slip-on shoes.  Thank you for allowing [place] to care for you!   -- [facility]    Follow-Up: At [facility], you and your health needs are our priority.  As part of our continuing mission to provide you with exceptional heart care, we have created designated Provider Care Teams.  These Care Teams include your primary Cardiologist (physician) and Advanced Practice Providers (APPs -  Physician  Assistants and Nurse Practitioners) who all work together to provide you with the care you need, when you need it.  We recommend signing up for the patient portal called "MyChart".  Sign up information is provided on this After Visit Summary.  MyChart is used to connect with patients for Virtual Visits (Telemedicine).  Patients are able to view lab/test results, encounter notes, upcoming appointments, etc.  Non-urgent messages can be sent to  your provider as well.   To learn more about what you can do with MyChart, go to [email or web address].    Your next appointment:   1 month(s)  The format for your next appointment:   In Person  Provider:   Jenne Campus, MD    Other Instructions   Aspirin and Your Heart  Aspirin is a medicine that prevents the cells in the blood that are used for clotting, called platelets, from sticking together. Aspirin can be used to help reduce the risk of blood clots, heart attacks, and other heart-related problems. Can I take aspirin? Your health care provider will help you determine whether it is safe and beneficial for you to take aspirin daily. Taking aspirin daily may be helpful if you:  Have had a heart attack or chest pain.  Are at risk for a heart attack.  Have undergone open-heart surgery, such as coronary artery bypass surgery (CABG).  Have had coronary angioplasty or a stent.  Have had certain types of stroke or transient ischemic attack (TIA).  Have peripheral artery disease (PAD).  Have chronic heart rhythm problems such as atrial fibrillation and cannot take an anticoagulant.  Have valve disease or have had surgery on a valve. What are the risks? Daily use of aspirin can cause side effects. Some of these include:  Bleeding. Bleeding problems can be minor or serious. An example of a minor problem is a cut that does not stop bleeding. An example of a more serious problem is stomach bleeding or, rarely, bleeding into the brain. Your risk of bleeding is increased if you are also taking non-steroidal anti-inflammatory drugs (NSAIDs).  Increased bruising.  Upset stomach.  An allergic reaction. People who have nasal polyps have an increased risk of developing an aspirin allergy. General guidelines  Take aspirin only as told by your health care provider. Make sure that you understand how much you should take and what form you should take. The two forms of aspirin  are: ? Non-enteric-coated.This type of aspirin does not have a coating and is absorbed quickly. This type of aspirin also comes in a chewable form. ? Enteric-coated. This type of aspirin has a coating that releases the medicine very slowly. Enteric-coated aspirin might cause less stomach upset than non-enteric-coated aspirin. This type of aspirin should not be chewed or crushed.  Limit alcohol intake to no more than 1 drink a day for nonpregnant women and 2 drinks a day for men. Drinking alcohol increases your risk of bleeding. One drink equals 12 oz of beer, 5 oz of wine, or 1 oz of hard liquor. Contact a health care provider if you:  Have unusual bleeding or bruising.  Have stomach pain or nausea.  Have ringing in your ears.  Have an allergic reaction that causes: ? Hives. ? Itchy skin. ? Swelling of the lips, tongue, or face. Get help right away if you:  Notice that your bowel movements are bloody, dark red, or black in color.  Vomit or cough up blood.  Have blood in your urine.  Cough,  have noisy breathing (wheeze), or feel short of breath.  Have chest pain, especially if the pain spreads to the arms, back, neck, or jaw.  Have a severe headache, or a headache with confusion, or dizziness. These symptoms may represent a serious problem that is an emergency. Do not wait to see if the symptoms will go away. Get medical help right away. Call your local emergency services (911 in the [place] Do not drive yourself to the hospital. Summary  Aspirin can be used to help reduce the risk of blood clots, heart attacks, and other heart-related problems.  Daily use of aspirin can increase your risk of side effects. Your health care provider will help you determine whether it is safe and beneficial for you to take aspirin daily.  Take aspirin only as told by your health care provider. Make sure that you understand how much you can take and what form you can take. This information is not  intended to replace advice given to you by your health care provider. Make sure you discuss any questions you have with your health care provider. Document Revised: 01/21/2017 Document Reviewed: 01/21/2017 Elsevier Patient Education  [employer].  Nitroglycerin sublingual tablets What is this medicine? NITROGLYCERIN (nye troe GLI ser in) is a type of vasodilator. It relaxes blood vessels, increasing the blood and oxygen supply to your heart. This medicine is used to relieve chest pain caused by angina. It is also used to prevent chest pain before activities like climbing stairs, going outdoors in cold weather, or sexual activity. This medicine may be used for other purposes; ask your health care provider or pharmacist if you have questions. COMMON BRAND NAME(S): Nitroquick, Nitrostat, Nitrotab What should I tell my health care provider before I take this medicine? They need to know if you have any of these conditions:  anemia  head injury, recent stroke, or bleeding in the brain  liver disease  previous heart attack  an unusual or allergic reaction to nitroglycerin, other medicines, foods, dyes, or preservatives  pregnant or trying to get pregnant  breast-feeding How should I use this medicine? Take this medicine by mouth as needed. At the first sign of an angina attack (chest pain or tightness) place one tablet under your tongue. You can also take this medicine 5 to 10 minutes before an event likely to produce chest pain. Follow the directions on the prescription label. Let the tablet dissolve under the tongue. Do not swallow whole. Replace the dose if you accidentally swallow it. It will help if your mouth is not dry. Saliva around the tablet will help it to dissolve more quickly. Do not eat or drink, smoke or chew tobacco while a tablet is dissolving. If you are not better within 5 minutes after taking ONE dose of nitroglycerin, call 9-1-1 immediately to seek emergency medical  care. Do not take more than 3 nitroglycerin tablets over 15 minutes. If you take this medicine often to relieve symptoms of angina, your doctor or health care professional may provide you with different instructions to manage your symptoms. If symptoms do not go away after following these instructions, it is important to call 9-1-1 immediately. Do not take more than 3 nitroglycerin tablets over 15 minutes. Talk to your pediatrician regarding the use of this medicine in children. Special care may be needed. Overdosage: If you think you have taken too much of this medicine contact a poison control center or emergency room at once. NOTE: This medicine is only for  you. Do not share this medicine with others. What if I miss a dose? This does not apply. This medicine is only used as needed. What may interact with this medicine? Do not take this medicine with any of the following medications:  certain migraine medicines like ergotamine and dihydroergotamine (DHE)  medicines used to treat erectile dysfunction like sildenafil, tadalafil, and vardenafil  riociguat This medicine may also interact with the following medications:  alteplase  aspirin  heparin  medicines for high blood pressure  medicines for mental depression  other medicines used to treat angina  phenothiazines like chlorpromazine, mesoridazine, prochlorperazine, thioridazine This list may not describe all possible interactions. Give your health care provider a list of all the medicines, herbs, non-prescription drugs, or dietary supplements you use. Also tell them if you smoke, drink alcohol, or use illegal drugs. Some items may interact with your medicine. What should I watch for while using this medicine? Tell your doctor or health care professional if you feel your medicine is no longer working. Keep this medicine with you at all times. Sit or lie down when you take your medicine to prevent falling if you feel dizzy or faint  after using it. Try to remain calm. This will help you to feel better faster. If you feel dizzy, take several deep breaths and lie down with your feet propped up, or bend forward with your head resting between your knees. You may get drowsy or dizzy. Do not drive, use machinery, or do anything that needs mental alertness until you know how this drug affects you. Do not stand or sit up quickly, especially if you are an older patient. This reduces the risk of dizzy or fainting spells. Alcohol can make you more drowsy and dizzy. Avoid alcoholic drinks. Do not treat yourself for coughs, colds, or pain while you are taking this medicine without asking your doctor or health care professional for advice. Some ingredients may increase your blood pressure. What side effects may I notice from receiving this medicine? Side effects that you should report to your doctor or health care professional as soon as possible:  blurred vision  dry mouth  skin rash  sweating  the feeling of extreme pressure in the head  unusually weak or tired Side effects that usually do not require medical attention (report to your doctor or health care professional if they continue or are bothersome):  flushing of the face or neck  headache  irregular heartbeat, palpitations  nausea, vomiting This list may not describe all possible side effects. Call your doctor for medical advice about side effects. You may report side effects to FDA at 1-800-FDA-1088. Where should I keep my medicine? Keep out of the reach of children. Store at room temperature between 20 and 25 degrees C (68 and 77 degrees F). Store in [employer]. Protect from light and moisture. Keep tightly closed. Throw away any unused medicine after the expiration date. NOTE: This sheet is a summary. It may not cover all possible information. If you have questions about this medicine, talk to your doctor, pharmacist, or health care provider.  2020  Elsevier/Gold Standard (2013-01-19 17:57:36)   Coronary Angiogram With Stent Coronary angiogram with stent placement is a procedure to widen or open a narrow blood vessel of the heart (coronary artery). Arteries may become blocked by cholesterol buildup (plaques) in the lining of the artery wall. When a coronary artery becomes partially blocked, blood flow to that area decreases. This may lead to chest pain  or a heart attack (myocardial infarction). A stent is a small piece of metal that looks like mesh or spring. Stent placement may be done as treatment after a heart attack, or to prevent a heart attack if a blocked artery is found by a coronary angiogram. Let your health care provider know about:  Any allergies you have, including allergies to medicines or contrast dye.  All medicines you are taking, including vitamins, herbs, eye drops, creams, and over-the-counter medicines.  Any problems you or family members have had with anesthetic medicines.  Any blood disorders you have.  Any surgeries you have had.  Any medical conditions you have, including kidney problems or kidney failure.  Whether you are pregnant or may be pregnant.  Whether you are breastfeeding. What are the risks? Generally, this is a safe procedure. However, serious problems may occur, including:  Damage to nearby structures or organs, such as the heart, blood vessels, or kidneys.  A return of blockage.  Bleeding, infection, or bruising at the insertion site.  A collection of blood under the skin (hematoma) at the insertion site.  A blood clot in another part of the body.  Allergic reaction to medicines or dyes.  Bleeding into the abdomen (retroperitoneal bleeding).  Stroke (rare).  Heart attack (rare). What happens before the procedure? Staying hydrated Follow instructions from your health care provider about hydration, which may include:  Up to 2 hours before the procedure - you may continue to  drink clear liquids, such as water, clear fruit juice, black coffee, and plain tea.  Eating and drinking restrictions Follow instructions from your health care provider about eating and drinking, which may include:  8 hours before the procedure - stop eating heavy meals or foods, such as meat, fried foods, or fatty foods.  6 hours before the procedure - stop eating light meals or foods, such as toast or cereal.  2 hours before the procedure - stop drinking clear liquids. Medicines Ask your health care provider about:  Changing or stopping your regular medicines. This is especially important if you are taking diabetes medicines or blood thinners.  Taking medicines such as aspirin and ibuprofen. These medicines can thin your blood. Do not take these medicines unless your health care provider tells you to take them. ? Generally, aspirin is recommended before a thin tube, called a catheter, is passed through a blood vessel and inserted into the heart (cardiac catheterization).  Taking over-the-counter medicines, vitamins, herbs, and supplements. General instructions  Do not use any products that contain nicotine or tobacco for at least 4 weeks before the procedure. These products include cigarettes, e-cigarettes, and chewing tobacco. If you need help quitting, ask your health care provider.  Plan to have someone take you home from the hospital or clinic.  If you will be going home right after the procedure, plan to have someone with you for 24 hours.  You may have tests and imaging procedures.  Ask your health care provider: ? How your insertion site will be marked. Ask which artery will be used for the procedure. ? What steps will be taken to help prevent infection. These may include:  Removing hair at the insertion site.  Washing skin with a germ-killing soap.  Taking antibiotic medicine. What happens during the procedure?   An IV will be inserted into one of your  veins.  Electrodes may be placed on your chest to monitor your heart rate during the procedure.  You will be given one or  more of the following: ? A medicine to help you relax (sedative). ? A medicine to numb the area (local anesthetic) for catheter insertion.  A small incision will be made for catheter insertion.  The catheter will be inserted into an artery using a guide wire. The location may be in your groin, your wrist, or the fold of your arm (near your elbow).  An X-ray procedure (fluoroscopy) will be used to help guide the catheter to the opening of the heart arteries.  A dye will be injected into the catheter. X-rays will be taken. The dye helps to show where any narrowing or blockages are located in the arteries.  Tell your health care provider if you have chest pain or trouble breathing.  A tiny wire will be guided to the blocked spot, and a balloon will be inflated to make the artery wider.  The stent will be expanded to crush the plaques into the wall of the vessel. The stent will hold the area open and improve the blood flow. Most stents have a drug coating to reduce the risk of the stent narrowing over time.  The artery may be made wider using a drill, laser, or other tools that remove plaques.  The catheter will be removed when the blood flow improves. The stent will stay where it was placed, and the lining of the artery will grow over it.  A bandage (dressing) will be placed on the insertion site. Pressure will be applied to stop bleeding.  The IV will be removed. This procedure may vary among health care providers and hospitals. What happens after the procedure?  Your blood pressure, heart rate, breathing rate, and blood oxygen level will be monitored until you leave the hospital or clinic.  If the procedure is done through the leg, you will lie flat in bed for a few hours or for as long as told by your health care provider. You will be instructed not to bend or  cross your legs.  The insertion site and the pulse in your foot or wrist will be checked often.  You may have more blood tests, X-rays, and a test that records the electrical activity of your heart (electrocardiogram, or ECG).  Do not drive for 24 hours if you were given a sedative during your procedure. Summary  Coronary angiogram with stent placement is a procedure to widen or open a narrowed coronary artery. This is done to treat heart problems.  Before the procedure, let your health care provider know about all the medical conditions and surgeries you have or have had.  This is a safe procedure. However, some problems may occur, including damage to nearby structures or organs, bleeding, blood clots, or allergies.  Follow your health care provider's instructions about eating, drinking, medicines, and other lifestyle changes, such as quitting tobacco use before the procedure. This information is not intended to replace advice given to you by your health care provider. Make sure you discuss any questions you have with your health care provider. Document Revised: 10/12/2018 Document Reviewed: 10/12/2018 Elsevier Patient Education  [place].  Metoprolol Extended-Release Tablets What is this medicine? METOPROLOL (me TOE proe lole) is a beta blocker. It decreases the amount of work your heart has to do and helps your heart beat regularly. It treats high blood pressure and/or prevent chest pain (also called angina). It also treats heart failure. This medicine may be used for other purposes; ask your health care provider or pharmacist if you have  questions. COMMON BRAND NAME(S): toprol, Toprol XL What should I tell my health care provider before I take this medicine? They need to know if you have any of these conditions:  diabetes  heart or vessel disease like slow heart rate, worsening heart failure, heart block, sick sinus syndrome or Raynaud's disease  kidney disease  liver  disease  lung or breathing disease, like asthma or emphysema  pheochromocytoma  thyroid disease  an unusual or allergic reaction to metoprolol, other beta-blockers, medicines, foods, dyes, or preservatives  pregnant or trying to get pregnant  breast-feeding How should I use this medicine? Take this drug by mouth. Take it as directed on the prescription label at the same time every day. Take it with food. You may cut the tablet in half if it is scored (has a line in the middle of it). This may help you swallow the tablet if the whole tablet is too big. Be sure to take both halves. Do not take just one-half of the tablet. Keep taking it unless your health care provider tells you to stop. Talk to your health care provider about the use of this drug in children. While it may be prescribed for children as young as 6 for selected conditions, precautions do apply. Overdosage: If you think you have taken too much of this medicine contact a poison control center or emergency room at once. NOTE: This medicine is only for you. Do not share this medicine with others. What if I miss a dose? If you miss a dose, take it as soon as you can. If it is almost time for your next dose, take only that dose. Do not take double or extra doses. What may interact with this medicine? This medicine may interact with the following medications:  certain medicines for blood pressure, heart disease, irregular heart beat  certain medicines for depression, like monoamine oxidase (MAO) inhibitors, fluoxetine, or paroxetine  clonidine  dobutamine  epinephrine  isoproterenol  reserpine This list may not describe all possible interactions. Give your health care provider a list of all the medicines, herbs, non-prescription drugs, or dietary supplements you use. Also tell them if you smoke, drink alcohol, or use illegal drugs. Some items may interact with your medicine. What should I watch for while using this  medicine? Visit your doctor or health care professional for regular check ups. Contact your doctor right away if your symptoms worsen. Check your blood pressure and pulse rate regularly. Ask your health care professional what your blood pressure and pulse rate should be, and when you should contact them. You may get drowsy or dizzy. Do not drive, use machinery, or do anything that needs mental alertness until you know how this medicine affects you. Do not sit or stand up quickly, especially if you are an older patient. This reduces the risk of dizzy or fainting spells. Contact your doctor if these symptoms continue. Alcohol may interfere with the effect of this medicine. Avoid alcoholic drinks. This medicine may increase blood sugar. Ask your healthcare provider if changes in diet or medicines are needed if you have diabetes. What side effects may I notice from receiving this medicine? Side effects that you should report to your doctor or health care professional as soon as possible:  allergic reactions like skin rash, itching or hives  cold or numb hands or feet  depression  difficulty breathing  faint  fever with sore throat  irregular heartbeat, chest pain  rapid weight gain   signs  and symptoms of high blood sugar such as being more thirsty or hungry or having to urinate more than normal. You may also feel very tired or have blurry vision.  swollen legs or ankles Side effects that usually do not require medical attention (report to your doctor or health care professional if they continue or are bothersome):  anxiety or nervousness  change in sex drive or performance  dry skin  headache  nightmares or trouble sleeping  short term memory loss  stomach upset or diarrhea This list may not describe all possible side effects. Call your doctor for medical advice about side effects. You may report side effects to FDA at 1-800-FDA-1088. Where should I keep my medicine? Keep out  of the reach of children and pets. Store at room temperature between 20 and 25 degrees C (68 and 77 degrees F). Throw away any unused drug after the expiration date. NOTE: This sheet is a summary. It may not cover all possible information. If you have questions about this medicine, talk to your doctor, pharmacist, or health care provider.  2020 Elsevier/Gold Standard (2018-11-03 18:23:00)

## 2019-08-18 NOTE — Progress Notes (Signed)
Cardiology Consultation:    Date:  08/18/2019   ID:  William Shea, DOB 01-Sep-1965, MRN [id number]  PCP:  Shelda Pal, DO  Cardiologist:  Jenne Campus, MD   Referring MD: Shelda Pal*   Chief Complaint  Patient presents with  . New Patient (Initial Visit)  I have a chest pain  History of Present Illness:    William Shea is a 54 y.o. male who is being seen today for the evaluation of chest pain at the request of Shelda Pal*.  For last 6 months has been experiencing typical chest tightness with exertion.  Initially it happened with only extreme exertion however lately became much more pronounced and much more easy to get.  He is [place] classification is 2/3.  Also what really make him concern is 3 weeks ago he did cut some tree and was trying to carry small pieces of the tree and developed severe tightness with shortness of breath.  He could barely finish the work.  At that point he became really concerned and up going to his primary care physician.  Denies having resting angina, however over.  Of last 6 months there is aggravation of his symptomatology. Risk factors include remote history of smoking, dyslipidemia he is on Crestor 20 in spite of that his LDL is still elevated and 146, he does have family history of premature coronary artery disease.  Also history of hypertension.  Past Medical History:  Diagnosis Date  . Allergy    seasonal  . Anxiety and depression 02/07/2014  . Arthritis of knee 10/17/2016  . Cervical disc disorder with radiculopathy of cervical region 12/27/2015  . Colon cancer screening 02/07/2014  . High cholesterol   . Hyperlipidemia 12/27/2015  . Hypertension   . Intermittent explosive disorder 05/01/2014  . Prostate cancer screening 02/19/2017  . Rash and nonspecific skin eruption 02/19/2017  . Visit for preventive health examination 07/19/2013  . Weight gain 06/17/2016    Past Surgical History:  Procedure Laterality  Date  . KNEE ARTHROSCOPY W/ MENISCAL REPAIR      Current Medications: Current Meds  Medication Sig  . albuterol (VENTOLIN HFA) 108 (90 Base) MCG/ACT inhaler Inhale 2 puffs into the lungs every 6 (six) hours as needed for wheezing or shortness of breath.  . escitalopram (LEXAPRO) 5 MG tablet TAKE ONE TABLET BY MOUTH ONE TIME DAILY  . fluticasone (FLONASE) 50 MCG/ACT nasal spray Place 2 sprays into both nostrils daily.  Marland Kitchen ibuprofen (ADVIL,MOTRIN) 200 MG tablet Take 200 mg by mouth every 6 (six) hours as needed.  Marland Kitchen lisinopril-hydrochlorothiazide (ZESTORETIC) 20-12.5 MG tablet Take 1 tablet by mouth 2 (two) times daily.  . Multiple Vitamins-Minerals (CENTRUM SILVER ADULT 50+) TABS Take 1 tablet by mouth daily.  . rosuvastatin (CRESTOR) 20 MG tablet TAKE ONE TABLET BY MOUTH ONE TIME DAILY     Allergies:   Patient has no known allergies.   Social History   Socioeconomic History  . Marital status: Married    Spouse name: Not on file  . Number of children: Not on file  . Years of education: Not on file  . Highest education level: Not on file  Occupational History  . Not on file  Tobacco Use  . Smoking status: Never Smoker  . Smokeless tobacco: Never Used  Substance and Sexual Activity  . Alcohol use: Yes    Alcohol/week: 20.0 standard drinks    Types: 20 Standard drinks or equivalent per week  . Drug use: Yes  Types: Marijuana    Comment: occ  . Sexual activity: Not on file  Other Topics Concern  . Not on file  Social History Narrative  . Not on file   Social Determinants of Health   Financial Resource Strain:   . Difficulty of Paying Living Expenses:   Food Insecurity:   . Worried About Charity fundraiser in the Last Year:   . Arboriculturist in the Last Year:   Transportation Needs:   . Film/video editor (Medical):   Marland Kitchen Lack of Transportation (Non-Medical):   Physical Activity:   . Days of Exercise per Week:   . Minutes of Exercise per Session:   Stress:   .  Feeling of Stress :   Social Connections:   . Frequency of Communication with Friends and Family:   . Frequency of Social Gatherings with Friends and Family:   . Attends Religious Services:   . Active Member of Clubs or Organizations:   . Attends Archivist Meetings:   Marland Kitchen Marital Status:      Family History: The patient's family history includes Alzheimer's disease in his maternal grandmother; Diabetes in his sister and another family member; Diabetes (age of onset: 54) in his father; Healthy in his son; Heart attack (age of onset: 53) in his father; Hypertension in his mother; Obesity in his father; Thyroid disease in his mother. There is no history of Colon cancer, Rectal cancer, Stomach cancer, Colon polyps, or Esophageal cancer. ROS:   Please see the history of present illness.    All 14 point review of systems negative except as described per history of present illness.  EKGs/Labs/Other Studies Reviewed:    The following studies were reviewed today:   EKG:  EKG is  ordered today.  The ekg ordered today demonstrates normal sinus rhythm, normal P interval, normal QS complex duration morphology.  Recent Labs: 03/10/2019: Hemoglobin 10.7; Platelets 405.0 04/13/2019: ALT 26; BUN 17; Creatinine, Ser 0.83; Potassium 4.2; Sodium 135  Recent Lipid Panel    Component Value Date/Time   CHOL 230 (H) 04/13/2019 0727   TRIG 227.0 (H) 04/13/2019 0727   HDL 53.90 04/13/2019 0727   CHOLHDL 4 04/13/2019 0727   VLDL 45.4 (H) 04/13/2019 0727   LDLCALC 110 (H) 06/10/2018 0804   LDLDIRECT 146.0 04/13/2019 0727    Physical Exam:    VS:  BP 130/80   Pulse 70   Ht 6' (1.829 m)   Wt 216 lb (98 kg)   SpO2 99%   BMI 29.29 kg/m     Wt Readings from Last 3 Encounters:  08/18/19 216 lb (98 kg)  07/28/19 213 lb 6 oz (96.8 kg)  05/12/19 217 lb (98.4 kg)     GEN:  Well nourished, well developed in no acute distress HEENT: Normal NECK: No JVD; No carotid bruits LYMPHATICS: No  lymphadenopathy CARDIAC: RRR, no murmurs, no rubs, no gallops RESPIRATORY:  Clear to auscultation without rales, wheezing or rhonchi  ABDOMEN: Soft, non-tender, non-distended MUSCULOSKELETAL:  No edema; No deformity  SKIN: Warm and dry NEUROLOGIC:  Alert and oriented x 3 PSYCHIATRIC:  Normal affect   ASSESSMENT:    1. Essential hypertension   2. Angina pectoris ([place])   3. Mixed hyperlipidemia    PLAN:    In order of problems listed above:  Typical angina pectoris.  Getting worse meaning more frequent, longer lasting episodes, easier effort can trigger chest pain.  We talked in length about what to do  with the situation my opinion the best approach is to pursue cardiac catheterization.  Procedure was explained to him including all risk benefits as well as alternative he is willing to proceed.  He told me he just cannot live the way that he is now.  In the meantime I will start aspirin every single day 81 mg daily, nitroglycerin will be given as needed, metoprolol titrate 25 mg twice daily.  He was told to go to the emergency room if pain is not relieved by nitroglycerin. 2.  Essential hypertension his blood pressure well controlled we will add also beta-blocker. 3.  Mixed dyslipidemia I will double the dose of Crestor from 20-40 and also warned him that in the future we will probably have to add some additional medications.  Medication Adjustments/Labs and Tests Ordered: Current medicines are reviewed at length with the patient today.  Concerns regarding medicines are outlined above.  No orders of the defined types were placed in this encounter.  No orders of the defined types were placed in this encounter.   Signed, Park Liter, MD, [facility]. 08/18/2019 3:27 PM    [facility]

## 2019-08-21 ENCOUNTER — Other Ambulatory Visit: Payer: Self-pay

## 2019-08-21 ENCOUNTER — Ambulatory Visit (HOSPITAL_BASED_OUTPATIENT_CLINIC_OR_DEPARTMENT_OTHER)
Admission: RE | Admit: 2019-08-21 | Discharge: 2019-08-21 | Disposition: A | Discharge status: Home / Self Care | Payer: No Typology Code available for payment source | Source: Ambulatory Visit | Attending: Cardiology | Admitting: Cardiology

## 2019-08-21 DIAGNOSIS — I209 Angina pectoris, unspecified: Secondary | ICD-10-CM | POA: Insufficient documentation

## 2019-08-22 ENCOUNTER — Encounter: Payer: Self-pay | Admitting: Cardiology

## 2019-08-22 LAB — BASIC METABOLIC PANEL
BUN/Creatinine Ratio: 19 (ref 9–20)
BUN: 17 mg/dL (ref 6–24)
CO2: 23 mmol/L (ref 20–29)
Calcium: 8.9 mg/dL (ref 8.7–10.2)
Chloride: 100 mmol/L (ref 96–106)
Creatinine, Ser: 0.9 mg/dL (ref 0.76–1.27)
GFR calc Af Amer: 111 mL/min/{1.73_m2} (ref >59)
GFR calc non Af Amer: 96 mL/min/{1.73_m2} (ref >59)
Glucose: 106 mg/dL — ABNORMAL HIGH (ref 65–99)
Potassium: 3.9 mmol/L (ref 3.5–5.2)
Sodium: 137 mmol/L (ref 134–144)

## 2019-08-22 LAB — CBC
Hematocrit: 27.3 % — ABNORMAL LOW (ref 37.5–51.0)
Hemoglobin: 8.1 g/dL — ABNORMAL LOW (ref 13.0–17.7)
MCH: 21.4 pg — ABNORMAL LOW (ref 26.6–33.0)
MCHC: 29.7 g/dL — ABNORMAL LOW (ref 31.5–35.7)
MCV: 72 fL — ABNORMAL LOW (ref 79–97)
Platelets: 319 10*3/uL (ref 150–450)
RBC: 3.78 x10E6/uL — ABNORMAL LOW (ref 4.14–5.80)
RDW: 17.1 % — ABNORMAL HIGH (ref 11.6–15.4)
WBC: 7.4 10*3/uL (ref 3.4–10.8)

## 2019-08-22 NOTE — Telephone Encounter (Signed)
error 

## 2019-08-23 ENCOUNTER — Telehealth: Payer: Self-pay | Admitting: *Deleted

## 2019-08-23 NOTE — Telephone Encounter (Signed)
   [facility] Pre-operative Risk Assessment    HEARTCARE STAFF: - Please ensure there is not already an duplicate clearance open for this procedure - Under Visit Info/Reason for Call, type in Other and utilize the format Clearance MM/DD/YY or Clearance TBD  Request for surgical clearance:  1. What type of surgery is being performed? Left Total Knee Arthroplasty  2. When is this surgery scheduled? 09/18/2019  3. What type of clearance is required (medical clearance vs. Pharmacy clearance to hold med vs. Both)? BOTH  4. Are there any medications that need to be held prior to surgery and how long? No  5. Practice name and name of physician performing surgery? [place], Dr. Jaci Standard  6. What is the office phone number? [phone], Please call Elisha Headland with any questions at [phone]   7.   What is the office fax number? [phone]  8.   Anesthesia type (None, local, MAC, general) ? Not Listed   Ronaldo Miyamoto 08/23/2019, 2:09 PM  _________________________________________________________________   (provider comments below)

## 2019-08-23 NOTE — Telephone Encounter (Signed)
I will send an FYI to the surgeon Dr. Jaci Standard pt has upcoming cath.

## 2019-08-23 NOTE — Telephone Encounter (Signed)
   Primary Cardiologist: Jenne Campus, MD  Chart reviewed as part of pre-operative protocol coverage. Patient was contacted 08/23/2019 in reference to pre-operative risk assessment for pending surgery as outlined below.  William Shea was last seen on 08/18/19 by Dr. Agustin Cree. This was an initial encounter for chest pain concerning for angina. He is scheduled for a heart catheterization on 08/30/19. If PCI is performed, he will need to be on uniterrupted DAPT for 12 months, unless a procedure is deemed urgent/emergent.   I will leave in the preop pool with plans to review angiography after 08/30/19. If PCI, will need to notify requesting provide and patient of the above.   [place], PA 08/23/2019, 2:34 PM

## 2019-08-24 ENCOUNTER — Telehealth: Payer: Self-pay

## 2019-08-24 NOTE — Telephone Encounter (Signed)
Spoke with patient regarding results.  Patient verbalizes understanding and is agreeable to plan of care. Advised patient to call back with any issues or concerns.  

## 2019-08-24 NOTE — Telephone Encounter (Signed)
-----   Message from Park Liter, MD sent at 08/24/2019 12:30 PM EDT ----- Chest x-ray looks good

## 2019-08-26 ENCOUNTER — Other Ambulatory Visit (HOSPITAL_COMMUNITY)
Admission: RE | Admit: 2019-08-26 | Discharge: 2019-08-26 | Disposition: A | Discharge status: Home / Self Care | Payer: No Typology Code available for payment source | Source: Ambulatory Visit | Attending: Cardiology | Admitting: Cardiology

## 2019-08-26 DIAGNOSIS — Z01812 Encounter for preprocedural laboratory examination: Secondary | ICD-10-CM | POA: Diagnosis not present

## 2019-08-26 DIAGNOSIS — Z20822 Contact with and (suspected) exposure to covid-19: Secondary | ICD-10-CM | POA: Insufficient documentation

## 2019-08-26 LAB — SARS CORONAVIRUS 2 (TAT 6-24 HRS): SARS Coronavirus 2: NEGATIVE

## 2019-08-28 ENCOUNTER — Telehealth: Payer: Self-pay | Admitting: Emergency Medicine

## 2019-08-28 DIAGNOSIS — D649 Anemia, unspecified: Secondary | ICD-10-CM

## 2019-08-28 NOTE — Telephone Encounter (Signed)
Called patient back. Informed him of his lab results and that his cath needs to be rescheduled and his cbc and stool sample needs to be checked. He verbally understood and will come by the office.

## 2019-08-28 NOTE — Telephone Encounter (Signed)
William Shea is returning Hayley's call. Please advise.

## 2019-08-28 NOTE — Telephone Encounter (Signed)
Left message for patient to return call. He will needs labs, and stool sample. He will need to cancel cardiac cath for now as well.

## 2019-08-28 NOTE — Addendum Note (Signed)
Addended by: Ashok Norris on: 08/28/2019 03:10 PM   Modules accepted: Orders

## 2019-08-29 ENCOUNTER — Other Ambulatory Visit: Payer: Self-pay | Admitting: Family Medicine

## 2019-08-29 DIAGNOSIS — F32A Depression, unspecified: Secondary | ICD-10-CM

## 2019-08-29 DIAGNOSIS — F419 Anxiety disorder, unspecified: Secondary | ICD-10-CM

## 2019-08-30 ENCOUNTER — Encounter (HOSPITAL_COMMUNITY): Admission: RE | Payer: Self-pay | Source: Home / Self Care

## 2019-08-30 ENCOUNTER — Ambulatory Visit (HOSPITAL_COMMUNITY)
Admission: RE | Admit: 2019-08-30 | Payer: No Typology Code available for payment source | Source: Home / Self Care | Admitting: Cardiology

## 2019-08-30 SURGERY — LEFT HEART CATH AND CORONARY ANGIOGRAPHY
Anesthesia: LOCAL

## 2019-08-31 LAB — CBC
Hematocrit: 25.9 % — ABNORMAL LOW (ref 37.5–51.0)
Hemoglobin: 7.7 g/dL — ABNORMAL LOW (ref 13.0–17.7)
MCH: 21.2 pg — ABNORMAL LOW (ref 26.6–33.0)
MCHC: 29.7 g/dL — ABNORMAL LOW (ref 31.5–35.7)
MCV: 71 fL — ABNORMAL LOW (ref 79–97)
Platelets: 456 10*3/uL — ABNORMAL HIGH (ref 150–450)
RBC: 3.63 x10E6/uL — ABNORMAL LOW (ref 4.14–5.80)
RDW: 16.6 % — ABNORMAL HIGH (ref 11.6–15.4)
WBC: 7.3 10*3/uL (ref 3.4–10.8)

## 2019-08-31 NOTE — Telephone Encounter (Signed)
[place], Dr. Jaci Standard and S/w Alison Murray, she states that pt called her yesterday and cancelled procedure.  We will await callback/fax for future cardiac clearance

## 2019-09-06 ENCOUNTER — Telehealth: Payer: Self-pay | Admitting: Cardiology

## 2019-09-06 DIAGNOSIS — D649 Anemia, unspecified: Secondary | ICD-10-CM

## 2019-09-06 LAB — FECAL OCCULT BLOOD, IMMUNOCHEMICAL: Fecal Occult Bld: NEGATIVE

## 2019-09-06 NOTE — Telephone Encounter (Signed)
Called patient back. Informed him of the results he was confused thinking there was a issue with blood sugar rather than hemoglobin I explained the issue was hemoglobin and he needs to see pcp for this he verbally understood. He reports  After getting off the phone confirmed with Dr. Agustin Cree that he does want his lab work repeated today. Called patient left message for him to call back so I could inform him of this.   Called again patient answered he will get labs first thing in the morning and go to the emergency room if he starts to feel worse.

## 2019-09-06 NOTE — Telephone Encounter (Signed)
    Pt is retuning call from [place] and [place]. He said he has questions about diagnosis and results

## 2019-09-07 LAB — CBC
Hematocrit: 28.5 % — ABNORMAL LOW (ref 37.5–51.0)
Hemoglobin: 8.4 g/dL — ABNORMAL LOW (ref 13.0–17.7)
MCH: 21.1 pg — ABNORMAL LOW (ref 26.6–33.0)
MCHC: 29.5 g/dL — ABNORMAL LOW (ref 31.5–35.7)
MCV: 72 fL — ABNORMAL LOW (ref 79–97)
Platelets: 430 10*3/uL (ref 150–450)
RBC: 3.98 x10E6/uL — ABNORMAL LOW (ref 4.14–5.80)
RDW: 16.3 % — ABNORMAL HIGH (ref 11.6–15.4)
WBC: 6.6 10*3/uL (ref 3.4–10.8)

## 2019-09-08 ENCOUNTER — Encounter: Payer: Self-pay | Admitting: Family Medicine

## 2019-09-08 ENCOUNTER — Ambulatory Visit: Payer: No Typology Code available for payment source | Admitting: Family Medicine

## 2019-09-08 ENCOUNTER — Other Ambulatory Visit: Payer: Self-pay

## 2019-09-08 VITALS — BP 108/64 | HR 69 | Temp 96.6°F | Ht 72.0 in | Wt 221.2 lb

## 2019-09-08 DIAGNOSIS — I10 Essential (primary) hypertension: Secondary | ICD-10-CM | POA: Diagnosis not present

## 2019-09-08 DIAGNOSIS — F32A Depression, unspecified: Secondary | ICD-10-CM

## 2019-09-08 DIAGNOSIS — F329 Major depressive disorder, single episode, unspecified: Secondary | ICD-10-CM | POA: Diagnosis not present

## 2019-09-08 DIAGNOSIS — D509 Iron deficiency anemia, unspecified: Secondary | ICD-10-CM | POA: Diagnosis not present

## 2019-09-08 DIAGNOSIS — F419 Anxiety disorder, unspecified: Secondary | ICD-10-CM | POA: Diagnosis not present

## 2019-09-08 LAB — IBC + FERRITIN
Ferritin: 5.3 ng/mL — ABNORMAL LOW (ref 22.0–322.0)
Iron: 19 ug/dL — ABNORMAL LOW (ref 42–165)
Saturation Ratios: 3.2 % — ABNORMAL LOW (ref 20.0–50.0)
Transferrin: 420 mg/dL — ABNORMAL HIGH (ref 212.0–360.0)

## 2019-09-08 LAB — URINALYSIS, MICROSCOPIC ONLY

## 2019-09-08 NOTE — Patient Instructions (Signed)
Give [place] 2-3 business days to get the results of your labs back.   Keep the diet clean and stay active.  Let me know if/when you need refills.  Let us know if you need anything.

## 2019-09-08 NOTE — Progress Notes (Signed)
Chief Complaint  Patient presents with  . Follow-up    Subjective William Shea presents for f/u anxiety/depression.  He is currently being treated with Lexapro 5 mg/d.  Reports doing well since treatment. No thoughts of harming self or others. No self-medication with alcohol, prescription drugs or illicit drugs.  Pt is not following with a counselor/psychologist.  Hypertension Patient presents for hypertension follow up. He does monitor home blood pressures. He is compliant with medications- Zestoretic 20-12.5 mg/d, metoprolol 25 mg bid. Patient has these side effects of medication: none He is adhering to a healthy diet overall. Exercise: active at work  Hb worse prior to cardiac procedure that was cancelled. Reck was largely unchanged. Nml CCS in Feb, 2021. FIT neg. Told to f/u here. No signs of bleeding. Does have a hx of a painless hemorrhoid, but that has not been giving him issues.   Past Medical History:  Diagnosis Date  . Allergy    seasonal  . Anxiety and depression 02/07/2014  . Arthritis of knee 10/17/2016  . Cervical disc disorder with radiculopathy of cervical region 12/27/2015  . Colon cancer screening 02/07/2014  . High cholesterol   . Hyperlipidemia 12/27/2015  . Hypertension   . Intermittent explosive disorder 05/01/2014  . Prostate cancer screening 02/19/2017  . Rash and nonspecific skin eruption 02/19/2017  . Visit for preventive health examination 07/19/2013  . Weight gain 06/17/2016   Allergies as of 09/08/2019   No Known Allergies     Medication List       Accurate as of September 08, 2019  7:55 AM. If you have any questions, ask your nurse or doctor.        albuterol 108 (90 Base) MCG/ACT inhaler Commonly known as: VENTOLIN HFA Inhale 2 puffs into the lungs every 6 (six) hours as needed for wheezing or shortness of breath.   aspirin EC 81 MG tablet Take 1 tablet (81 mg total) by mouth daily.   Centrum Silver Adult 50+ Tabs Take 1 tablet by mouth  daily.   escitalopram 5 MG tablet Commonly known as: LEXAPRO TAKE ONE TABLET BY MOUTH ONE TIME DAILY   fluticasone 50 MCG/ACT nasal spray Commonly known as: FLONASE Place 2 sprays into both nostrils daily. What changed:   when to take this  reasons to take this   ibuprofen 200 MG tablet Commonly known as: ADVIL Take 200 mg by mouth every 6 (six) hours as needed for moderate pain.   lisinopril-hydrochlorothiazide 20-12.5 MG tablet Commonly known as: ZESTORETIC Take 1 tablet by mouth 2 (two) times daily.   meloxicam 7.5 MG tablet Commonly known as: MOBIC Take 7.5 mg by mouth daily as needed for pain.   metoprolol tartrate 25 MG tablet Commonly known as: LOPRESSOR Take 1 tablet (25 mg total) by mouth 2 (two) times daily.   nitroGLYCERIN 0.4 MG SL tablet Commonly known as: NITROSTAT Place 1 tablet (0.4 mg total) under the tongue every 5 (five) minutes as needed.   rosuvastatin 40 MG tablet Commonly known as: CRESTOR Take 1 tablet (40 mg total) by mouth daily. What changed: how much to take       Exam BP 108/64 (BP Location: Left Arm, Patient Position: Sitting, Cuff Size: Normal)   Pulse 69   Temp (!) 96.6 F (35.9 C) (Temporal)   Ht 6' (1.829 m)   Wt 221 lb 4 oz (100.4 kg)   SpO2 98%   BMI 30.01 kg/m  General:  well developed, well nourished, in no  apparent distress Neck: neck supple without adenopathy, thyromegaly, or masses Lungs:  clear to auscultation, breath sounds equal bilaterally, no respiratory distress Cardio:  regular rate and rhythm without murmurs, heart sounds without clicks or rubs Psych: well oriented with normal range of affect and age-appropriate judgement/insight, alert and oriented x4.  Assessment and Plan  Anxiety and depression  Essential hypertension  Microcytic anemia - Plan: CBC (INCLUDES DIFF/PLT) WITH PATHOLOGIST REVIEW, Urine Microscopic Only, IBC + Ferritin  1- Cont Lexapro 2- Cont meds, OK to hold off on meds. Counseled on  diet/exercise. 3- F/u on anemia. FIT neg, CCS largely unremarkable in Feb of this year. No jaundice or hematuria. Ck urine, smear. If urine + for blood, will refer to urology without reck this time. If neg, will refer to hematology.  F/u in 6 mo for CPE. The patient voiced understanding and agreement to the plan.  [place], DO 09/08/19 7:55 AM

## 2019-09-11 ENCOUNTER — Other Ambulatory Visit: Payer: Self-pay | Admitting: Family Medicine

## 2019-09-11 DIAGNOSIS — D509 Iron deficiency anemia, unspecified: Secondary | ICD-10-CM

## 2019-09-11 LAB — CBC (INCLUDES DIFF/PLT) WITH PATHOLOGIST REVIEW
Absolute Monocytes: 690 cells/uL (ref 200–950)
Basophils Absolute: 107 cells/uL (ref 0–200)
Basophils Relative: 1.6 %
Eosinophils Absolute: 496 cells/uL (ref 15–500)
Eosinophils Relative: 7.4 %
HCT: 26.9 % — ABNORMAL LOW (ref 38.5–50.0)
Hemoglobin: 8.1 g/dL — ABNORMAL LOW (ref 13.2–17.1)
Lymphs Abs: 1554 cells/uL (ref 850–3900)
MCH: 21.1 pg — ABNORMAL LOW (ref 27.0–33.0)
MCHC: 30.1 g/dL — ABNORMAL LOW (ref 32.0–36.0)
MCV: 70.2 fL — ABNORMAL LOW (ref 80.0–100.0)
MPV: 9 fL (ref 7.5–12.5)
Monocytes Relative: 10.3 %
Neutro Abs: 3853 cells/uL (ref 1500–7800)
Neutrophils Relative %: 57.5 %
Platelets: 399 10*3/uL (ref 140–400)
RBC: 3.83 10*6/uL — ABNORMAL LOW (ref 4.20–5.80)
RDW: 16.7 % — ABNORMAL HIGH (ref 11.0–15.0)
Total Lymphocyte: 23.2 %
WBC: 6.7 10*3/uL (ref 3.8–10.8)

## 2019-09-13 ENCOUNTER — Telehealth: Payer: Self-pay

## 2019-09-13 NOTE — Telephone Encounter (Signed)
Patient called in to see if Dr. Nani Ravens or the nurse could give him a call to discuss his lab result's. Please call the patient at [phone]

## 2019-09-13 NOTE — Telephone Encounter (Signed)
Called informed of PCP instructions. 

## 2019-09-13 NOTE — Telephone Encounter (Signed)
His hemoglobin is stable, just not where we want it to be. It is probably more of an absorption issue so the heme team can help get him iron through an IV and help with this. Also can help with seeing if anything else needs to be done. Too soon to be very concerned. TY.

## 2019-09-13 NOTE — Telephone Encounter (Signed)
Called the patient informed of results/copied from result notes  Hi William Shea- It was good seeing you the other day. Blood count is stable. Iron is low. Urine is normal. We have sent a referral to the hematology team (blood specialists). Someone should reach out shortly. In the meantime, take up to 3 tabs of ferrous sulfate daily, which is over the counter iron. Let us know    He is very concerned  Regarding his results/he has not heard back from the referral to hematology. I will followup with [facility] today. He wants to know how concerned should he be?

## 2019-09-19 ENCOUNTER — Other Ambulatory Visit: Payer: Self-pay | Admitting: Family

## 2019-09-19 DIAGNOSIS — D649 Anemia, unspecified: Secondary | ICD-10-CM

## 2019-09-20 ENCOUNTER — Other Ambulatory Visit: Payer: Self-pay

## 2019-09-20 ENCOUNTER — Encounter: Payer: Self-pay | Admitting: Family

## 2019-09-20 ENCOUNTER — Inpatient Hospital Stay (HOSPITAL_BASED_OUTPATIENT_CLINIC_OR_DEPARTMENT_OTHER): Payer: No Typology Code available for payment source | Admitting: Family

## 2019-09-20 ENCOUNTER — Inpatient Hospital Stay: Payer: No Typology Code available for payment source | Attending: Hematology & Oncology

## 2019-09-20 ENCOUNTER — Telehealth: Payer: Self-pay | Admitting: Cardiology

## 2019-09-20 VITALS — BP 134/77 | HR 72 | Temp 98.7°F | Resp 18 | Ht 72.0 in | Wt 222.0 lb

## 2019-09-20 DIAGNOSIS — R140 Abdominal distension (gaseous): Secondary | ICD-10-CM

## 2019-09-20 DIAGNOSIS — R109 Unspecified abdominal pain: Secondary | ICD-10-CM

## 2019-09-20 DIAGNOSIS — K760 Fatty (change of) liver, not elsewhere classified: Secondary | ICD-10-CM

## 2019-09-20 DIAGNOSIS — D649 Anemia, unspecified: Secondary | ICD-10-CM

## 2019-09-20 DIAGNOSIS — D509 Iron deficiency anemia, unspecified: Secondary | ICD-10-CM | POA: Insufficient documentation

## 2019-09-20 LAB — CBC WITH DIFFERENTIAL (CANCER CENTER ONLY)
Abs Immature Granulocytes: 0.04 10*3/uL (ref 0.00–0.07)
Basophils Absolute: 0.1 10*3/uL (ref 0.0–0.1)
Basophils Relative: 1 %
Eosinophils Absolute: 0.5 10*3/uL (ref 0.0–0.5)
Eosinophils Relative: 6 %
HCT: 27.9 % — ABNORMAL LOW (ref 39.0–52.0)
Hemoglobin: 8.2 g/dL — ABNORMAL LOW (ref 13.0–17.0)
Immature Granulocytes: 1 %
Lymphocytes Relative: 27 %
Lymphs Abs: 2.3 10*3/uL (ref 0.7–4.0)
MCH: 21.8 pg — ABNORMAL LOW (ref 26.0–34.0)
MCHC: 29.4 g/dL — ABNORMAL LOW (ref 30.0–36.0)
MCV: 74 fL — ABNORMAL LOW (ref 80.0–100.0)
Monocytes Absolute: 0.7 10*3/uL (ref 0.1–1.0)
Monocytes Relative: 9 %
Neutro Abs: 4.8 10*3/uL (ref 1.7–7.7)
Neutrophils Relative %: 56 %
Platelet Count: 334 10*3/uL (ref 150–400)
RBC: 3.77 MIL/uL — ABNORMAL LOW (ref 4.22–5.81)
RDW: 18.4 % — ABNORMAL HIGH (ref 11.5–15.5)
WBC Count: 8.4 10*3/uL (ref 4.0–10.5)
nRBC: 0 % (ref 0.0–0.2)

## 2019-09-20 LAB — CMP (CANCER CENTER ONLY)
ALT: 38 U/L (ref 0–44)
AST: 47 U/L — ABNORMAL HIGH (ref 15–41)
Albumin: 4.5 g/dL (ref 3.5–5.0)
Alkaline Phosphatase: 76 U/L (ref 38–126)
Anion gap: 10 (ref 5–15)
BUN: 20 mg/dL (ref 6–20)
CO2: 24 mmol/L (ref 22–32)
Calcium: 9 mg/dL (ref 8.9–10.3)
Chloride: 97 mmol/L — ABNORMAL LOW (ref 98–111)
Creatinine: 0.9 mg/dL (ref 0.61–1.24)
GFR, Est AFR Am: >60 mL/min (ref >60)
GFR, Estimated: >60 mL/min (ref >60)
Glucose, Bld: 115 mg/dL — ABNORMAL HIGH (ref 70–99)
Potassium: 4.2 mmol/L (ref 3.5–5.1)
Sodium: 131 mmol/L — ABNORMAL LOW (ref 135–145)
Total Bilirubin: 0.5 mg/dL (ref 0.3–1.2)
Total Protein: 7.7 g/dL (ref 6.5–8.1)

## 2019-09-20 LAB — RETICULOCYTES
Immature Retic Fract: 29.7 % — ABNORMAL HIGH (ref 2.3–15.9)
RBC.: 3.79 MIL/uL — ABNORMAL LOW (ref 4.22–5.81)
Retic Count, Absolute: 109.9 10*3/uL (ref 19.0–186.0)
Retic Ct Pct: 2.9 % (ref 0.4–3.1)

## 2019-09-20 LAB — SAVE SMEAR(SSMR), FOR PROVIDER SLIDE REVIEW

## 2019-09-20 NOTE — Telephone Encounter (Signed)
William Shea is calling wanting to know if Dr. Agustin Cree still feels his appointment scheduled for Friday 09/22/19 is necessary due to him sending him back to his PCP. Please advise.

## 2019-09-20 NOTE — Telephone Encounter (Signed)
Yes, I still needs to see him

## 2019-09-20 NOTE — Progress Notes (Signed)
Hematology/Oncology Consultation   Name: William Shea      MRN: [id number]    Location: Room/bed info not found  Date: 09/20/2019 Time:3:36 PM   REFERRING PHYSICIAN: Hart Carwin P. Wendling, Shea  REASON FOR CONSULT: Iron deficiency anemia    DIAGNOSIS: Iron deficiency anemia   HISTORY OF PRESENT ILLNESS: Mr. William Shea is a very pleasant 54 yo caucasian gentleman with recent diagnosis of iron deficiency anemia.  He noted fatigued, occasional dizziness, SOB and chest discomfort several weeks ago after cutting a tree branch.  His iron saturation at that times was only 3% and ferritin 5.  He is currently on an oral iron supplement (65 mg) PO TID.  He had a colonoscopy with William Shea back in and had a 3 mm poly removed that he states was benign. He also had a  few small-mouthed diverticula in the sigmoid colon and medium sized non-bleeding internal hemorrhoids.  He did not have an endoscopy or swallow the camera pill.  He states that he does drink quite a bit in the evenings. He has 3-5 vodka cranberry drinks a night.  He states that cardiology had him scheduled for heart cath but this was cancelled once they saw her was no iron deficient. She has occasional headaches off and on.  He states that he had his colonoscopy earlier this year in February and states that he had hemorrhoids as well as 1 benign polyp removed.  He states that his hemorrhoid occasionally bleeds. His hemoccult testing 2 weeks ago was negative.  He has had multiple surgeries in the past without any complications/bleeding. No family history of anemia that he knows of.  No personal or familial history of cancer.  No fever, chills, n/v, cough, rash, palpitations or changes in bowel or bladder habits.  He has the occasional "knotting" feeling and discomfort in the upper abdomen. This comes and goes.   He also notes abdominal bloating at times.  No swelling, tenderness, numbness or tingling in his extremities.  No falls or  syncopal episodes.  He has maintained a good appetite and is staying well hydrated. His weight is stable.  He does a lot of heavy lifting and walking with work.    ROS: All other 10 point review of systems is negative.   PAST MEDICAL HISTORY:   Past Medical History:  Diagnosis Date  . Allergy    seasonal  . Anxiety and depression 02/07/2014  . Arthritis of knee 10/17/2016  . Cervical disc disorder with radiculopathy of cervical region 12/27/2015  . Colon cancer screening 02/07/2014  . High cholesterol   . Hyperlipidemia 12/27/2015  . Hypertension   . Intermittent explosive disorder 05/01/2014  . Prostate cancer screening 02/19/2017  . Rash and nonspecific skin eruption 02/19/2017  . Visit for preventive health examination 07/19/2013  . Weight gain 06/17/2016    ALLERGIES: No Known Allergies    MEDICATIONS:  Current Outpatient Medications on File Prior to Visit  Medication Sig Dispense Refill  . albuterol (VENTOLIN HFA) 108 (90 Base) MCG/ACT inhaler Inhale 2 puffs into the lungs every 6 (six) hours as needed for wheezing or shortness of breath. 18 g 1  . aspirin EC 81 MG tablet Take 1 tablet (81 mg total) by mouth daily. 90 tablet 3  . escitalopram (LEXAPRO) 5 MG tablet TAKE ONE TABLET BY MOUTH ONE TIME DAILY 30 tablet 3  . ferrous sulfate 324 (65 Fe) MG TBEC Take by mouth.    . fluticasone (FLONASE) 50 MCG/ACT nasal  spray Place 2 sprays into both nostrils daily. (Patient taking differently: Place 2 sprays into both nostrils daily as needed for allergies or rhinitis. ) 16 g 2  . ibuprofen (ADVIL,MOTRIN) 200 MG tablet Take 200 mg by mouth every 6 (six) hours as needed for moderate pain.     Marland Kitchen lisinopril-hydrochlorothiazide (ZESTORETIC) 20-12.5 MG tablet Take 1 tablet by mouth 2 (two) times daily. 60 tablet 11  . meloxicam (MOBIC) 7.5 MG tablet Take 7.5 mg by mouth daily as needed for pain.    . metoprolol tartrate (LOPRESSOR) 25 MG tablet Take 1 tablet (25 mg total) by mouth 2 (two)  times daily. 60 tablet 2  . Multiple Vitamins-Minerals (CENTRUM SILVER ADULT 50+) TABS Take 1 tablet by mouth daily.    . nitroGLYCERIN (NITROSTAT) 0.4 MG SL tablet Place 1 tablet (0.4 mg total) under the tongue every 5 (five) minutes as needed. 25 tablet 11  . rosuvastatin (CRESTOR) 40 MG tablet Take 1 tablet (40 mg total) by mouth daily. (Patient taking differently: Take 20 mg by mouth daily. ) 30 tablet 2  . [DISCONTINUED] lisinopril (PRINIVIL,ZESTRIL) 20 MG tablet Take 1 tablet (20 mg total) by mouth daily. 30 tablet 0   No current facility-administered medications on file prior to visit.     PAST SURGICAL HISTORY Past Surgical History:  Procedure Laterality Date  . KNEE ARTHROSCOPY W/ MENISCAL REPAIR      FAMILY HISTORY: Family History  Problem Relation Age of Onset  . Hypertension Mother        Living  . Thyroid disease Mother   . Diabetes Father 41       Deceased  . Obesity Father   . Heart attack Father 61  . Diabetes Other        Paternal Side of Family  . Alzheimer's disease Maternal Grandmother   . Diabetes Sister   . Healthy Son        x3  . Colon cancer Neg Hx   . Rectal cancer Neg Hx   . Stomach cancer Neg Hx   . Colon polyps Neg Hx   . Esophageal cancer Neg Hx     SOCIAL HISTORY:  reports that he has never smoked. He has never used smokeless tobacco. He reports current alcohol use of about 20.0 standard drinks of alcohol per week. He reports current drug use. Drug: Marijuana.  PERFORMANCE STATUS: The patient's performance status is 1 - Symptomatic but completely ambulatory  PHYSICAL EXAM: Most Recent Vital Signs: Blood pressure 134/77, pulse 72, temperature 98.7 F (37.1 C), temperature source Oral, resp. rate 18, height 6' (1.829 m), weight 222 lb (100.7 kg), SpO2 100 %. BP 134/77 (BP Location: Right Arm, Patient Position: Sitting)   Pulse 72   Temp 98.7 F (37.1 C) (Oral)   Resp 18   Ht 6' (1.829 m)   Wt 222 lb (100.7 kg)   SpO2 100%   BMI 30.11  kg/m   General Appearance:    Alert, cooperative, no distress, appears stated age  Head:    Normocephalic, without obvious abnormality, atraumatic  Eyes:    PERRL, conjunctiva/corneas clear, EOM's intact, fundi    benign, both eyes             Throat:   Lips, mucosa, and tongue normal; teeth and gums normal  Neck:   Supple, symmetrical, trachea midline, no adenopathy;       thyroid:  No enlargement/tenderness/nodules; no carotid   bruit or JVD  Back:  Symmetric, no curvature, ROM normal, no CVA tenderness  Lungs:     Clear to auscultation bilaterally, respirations unlabored  Chest wall:    No tenderness or deformity  Heart:    Regular rate and rhythm, S1 and S2 normal, no murmur, rub   or gallop  Abdomen:     Soft, non-tender, bowel sounds active all four quadrants,    no masses, no organomegaly        Extremities:   Extremities normal, atraumatic, no cyanosis or edema  Pulses:   2+ and symmetric all extremities  Skin:   Skin color, texture, turgor normal, no rashes or lesions  Lymph nodes:   Cervical, supraclavicular, and axillary nodes normal  Neurologic:   CNII-XII intact. Normal strength, sensation and reflexes      throughout    LABORATORY DATA:  Results for orders placed or performed in visit on 09/20/19 (from the past 48 hour(s))  CBC with Differential ([place] Only)     Status: Abnormal   Collection Time: 09/20/19  3:19 PM  Result Value Ref Range   WBC Count 8.4 4.0 - 10.5 K/uL   RBC 3.77 (L) 4.22 - 5.81 MIL/uL   Hemoglobin 8.2 (L) 13.0 - 17.0 g/dL    Comment: Reticulocyte Hemoglobin testing may be clinically indicated, consider ordering this additional test [id number]    HCT 27.9 (L) 39 - 52 %   MCV 74.0 (L) 80.0 - 100.0 fL   MCH 21.8 (L) 26.0 - 34.0 pg   MCHC 29.4 (L) 30.0 - 36.0 g/dL   RDW 18.4 (H) 11.5 - 15.5 %   Platelet Count 334 150 - 400 K/uL   nRBC 0.0 0.0 - 0.2 %   Neutrophils Relative % 56 %   Neutro Abs 4.8 1.7 - 7.7 K/uL   Lymphocytes  Relative 27 %   Lymphs Abs 2.3 0.7 - 4.0 K/uL   Monocytes Relative 9 %   Monocytes Absolute 0.7 0 - 1 K/uL   Eosinophils Relative 6 %   Eosinophils Absolute 0.5 0 - 0 K/uL   Basophils Relative 1 %   Basophils Absolute 0.1 0 - 0 K/uL   Immature Granulocytes 1 %   Abs Immature Granulocytes 0.04 0.00 - 0.07 K/uL    Comment: Performed at [facility], [address] [place], [place] [address]  Save Smear [facility])     Status: None   Collection Time: 09/20/19  3:19 PM  Result Value Ref Range   Smear Review SMEAR STAINED AND AVAILABLE FOR REVIEW     Comment: Performed at [facility] at [facility], [address], [place], [place] [address]  Reticulocytes     Status: Abnormal   Collection Time: 09/20/19  3:20 PM  Result Value Ref Range   Retic Ct Pct 2.9 0.4 - 3.1 %   RBC. 3.79 (L) 4.22 - 5.81 MIL/uL   Retic Count, Absolute 109.9 19.0 - 186.0 K/uL   Immature Retic Fract 29.7 (H) 2.3 - 15.9 %    Comment: Performed at [facility] at [place], [address] [place], [place] [address]      RADIOGRAPHY: No results found.     PATHOLOGY: None  ASSESSMENT/PLAN: Mr. Sorbo is a very pleasant 54 yo caucasian gentleman with recent diagnosis of iron deficiency anemia felt to be due to intermittent GI blood loss.  He is symptomatic as mentioned above.  We will see what his iron studies look like and replace if needed.  We Shea recommend that GI move forward with endoscopy. Possible blood loss with varices based on patient daily alcohol consumption.  We will schedule him for CT of the abdomen and pelvis to r/o cirrhosis.  Once we have his results we can schedule his follow-up.   All questions were answered and He is in agreement with the plan. He will contact our office with any questions or concerns. We can certainly see him sooner if needed.   He was discussed with and also seen by Dr. Marin Olp and  he is in agreement with the aforementioned.   Laverna Peace, NP   ADDENDUM: I saw and examined Mr. Sharps with Judson Roch.  It was fun talking to him.  I still had believe that he is having some kind of GI bleeding.  He really needs to have an upper endoscopy.  His iron studies show a ferritin of 8.  His iron saturation is 42%.  I cannot recall the last time a male who was his age with iron deficiency did not have GI bleeding.  Again, I Shea think an upper endoscopy would be reasonable.  I realize that his stool has been heme-negative.  Again, I just believe that there is some source of bleeding.  I did look at his blood smear under the microscope.  I Shea not see anything that looked like an underlying hematologic malignancy.  We are probably going to have to give him some more iron.  We spent about 45 minutes with him.  We answered his questions.  We reviewed his lab work.  We will have to try to get hold of his gastroenterologist and see if she will agree to an upper endoscopy and possible capsule endoscopy.  Lattie Haw, MD

## 2019-09-21 ENCOUNTER — Telehealth: Payer: Self-pay | Admitting: Cardiology

## 2019-09-21 ENCOUNTER — Telehealth: Payer: Self-pay | Admitting: Hematology & Oncology

## 2019-09-21 LAB — IRON AND TIBC
Iron: 214 ug/dL — ABNORMAL HIGH (ref 42–163)
Saturation Ratios: 42 % (ref 20–55)
TIBC: 514 ug/dL — ABNORMAL HIGH (ref 202–409)
UIBC: 300 ug/dL (ref 117–376)

## 2019-09-21 LAB — ERYTHROPOIETIN: Erythropoietin: 76.7 m[IU]/mL — ABNORMAL HIGH (ref 2.6–18.5)

## 2019-09-21 LAB — FERRITIN: Ferritin: 8 ng/mL — ABNORMAL LOW (ref 24–336)

## 2019-09-21 LAB — LACTATE DEHYDROGENASE: LDH: 169 U/L (ref 98–192)

## 2019-09-21 NOTE — Telephone Encounter (Signed)
     I went in pt's chart to see who had called him this morning. Pt wanted to cx his appt for tomorrow with Dr Agustin Cree. He decided he did not need to speak to the person who had called him.

## 2019-09-21 NOTE — Telephone Encounter (Signed)
No los 6/16

## 2019-09-21 NOTE — Telephone Encounter (Signed)
Left patient a detailed message letting him know that Dr. Agustin Cree still wanted to see him tomorrow 09/22/19. I also gave him our call back number to call us back with any other issues or concerns.

## 2019-09-22 ENCOUNTER — Encounter (HOSPITAL_BASED_OUTPATIENT_CLINIC_OR_DEPARTMENT_OTHER): Payer: Self-pay

## 2019-09-22 ENCOUNTER — Ambulatory Visit: Payer: No Typology Code available for payment source | Admitting: Cardiology

## 2019-09-22 ENCOUNTER — Other Ambulatory Visit: Payer: Self-pay

## 2019-09-22 ENCOUNTER — Ambulatory Visit (HOSPITAL_BASED_OUTPATIENT_CLINIC_OR_DEPARTMENT_OTHER)
Admission: RE | Admit: 2019-09-22 | Discharge: 2019-09-22 | Disposition: A | Discharge status: Home / Self Care | Payer: No Typology Code available for payment source | Source: Ambulatory Visit | Attending: Family | Admitting: Family

## 2019-09-22 DIAGNOSIS — K760 Fatty (change of) liver, not elsewhere classified: Secondary | ICD-10-CM | POA: Diagnosis present

## 2019-09-22 DIAGNOSIS — R109 Unspecified abdominal pain: Secondary | ICD-10-CM | POA: Insufficient documentation

## 2019-09-22 DIAGNOSIS — R140 Abdominal distension (gaseous): Secondary | ICD-10-CM | POA: Diagnosis present

## 2019-09-22 MED ORDER — IOHEXOL 300 MG/ML  SOLN
100.0000 mL | Freq: Once | INTRAMUSCULAR | Status: AC | PRN
  Administered 2019-09-22: 100 mL via INTRAVENOUS

## 2019-09-27 ENCOUNTER — Telehealth: Payer: Self-pay | Admitting: Family

## 2019-09-27 NOTE — Telephone Encounter (Signed)
Appointments scheduled calendar printed & mailed per 6/23 los

## 2019-09-27 NOTE — Telephone Encounter (Signed)
I was able to speak with William Shea about his CT scan and recent lab results. Iron studies continue to improve so he will stay on his same oral iron supplement regimen daily.  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 He will try to cut back on his daily alcohol intake.  We will see him again in 8 weeks for follow-up.  He is in agreement and has no other questions at this time.

## 2019-10-02 ENCOUNTER — Other Ambulatory Visit: Payer: Self-pay | Admitting: Family Medicine

## 2019-10-02 DIAGNOSIS — I10 Essential (primary) hypertension: Secondary | ICD-10-CM

## 2019-10-16 ENCOUNTER — Other Ambulatory Visit: Payer: Self-pay | Admitting: Family Medicine

## 2019-10-16 DIAGNOSIS — D509 Iron deficiency anemia, unspecified: Secondary | ICD-10-CM

## 2019-10-16 MED ORDER — ONDANSETRON 4 MG PO TBDP
4.0000 mg | ORAL_TABLET | Freq: Three times a day (TID) | ORAL | 0 refills | Status: DC | PRN
Start: 2019-10-16 — End: 2019-11-03

## 2019-10-16 NOTE — Telephone Encounter (Signed)
He would like something sent in for nausea as well.

## 2019-10-18 ENCOUNTER — Encounter: Payer: Self-pay | Admitting: Gastroenterology

## 2019-10-18 ENCOUNTER — Ambulatory Visit (INDEPENDENT_AMBULATORY_CARE_PROVIDER_SITE_OTHER): Payer: No Typology Code available for payment source | Admitting: Gastroenterology

## 2019-10-18 ENCOUNTER — Other Ambulatory Visit (INDEPENDENT_AMBULATORY_CARE_PROVIDER_SITE_OTHER): Payer: No Typology Code available for payment source

## 2019-10-18 DIAGNOSIS — D509 Iron deficiency anemia, unspecified: Secondary | ICD-10-CM

## 2019-10-18 DIAGNOSIS — Z01818 Encounter for other preprocedural examination: Secondary | ICD-10-CM

## 2019-10-18 LAB — HEMATOCRIT: HCT: 35 % — ABNORMAL LOW (ref 39.0–52.0)

## 2019-10-18 LAB — HEMOGLOBIN: Hemoglobin: 11.1 g/dL — ABNORMAL LOW (ref 13.0–17.0)

## 2019-10-18 NOTE — Patient Instructions (Addendum)
If you are age 56 or older, your body mass index should be between 23-30. Your Body mass index is 29.31 kg/m. If this is out of the aforementioned range listed, please consider follow up with your Primary Care Provider.  If you are age 78 or younger, your body mass index should be between 19-25. Your Body mass index is 29.31 kg/m. If this is out of the aformentioned range listed, please consider follow up with your Primary Care Provider.   You have been scheduled for an endoscopy. Please follow written instructions given to you at your visit today. If you use inhalers (even only as needed), please bring them with you on the day of your procedure.  Your provider has requested that you go to the basement level for lab work before leaving today. Press "B" on the elevator. The lab is located at the first door on the left as you exit the elevator.  Due to recent changes in healthcare laws, you may see the results of your imaging and laboratory studies on MyChart before your provider has had a chance to review them.  We understand that in some cases there may be results that are confusing or concerning to you. Not all laboratory results come back in the same time frame and the provider may be waiting for multiple results in order to interpret others.  Please give [place] 48 hours in order for your provider to thoroughly review all the results before contacting the office for clarification of your results.   I appreciate the opportunity to care for you. Thank you for choosing me and [facility],  Dr. Harl Bowie

## 2019-10-18 NOTE — Progress Notes (Signed)
William Shea    [id number]    January 15, 1966  Primary Care Physician:Wendling, Crosby Oyster, DO  Referring Physician: Shelda Pal, DO [address] [place],  [place] [address]   Chief complaint: Iron deficiency anemia  HPI:  54 year old very pleasant gentleman here for evaluation of iron deficiency anemia  He takes ibuprofen intermittently on average 3 to 5 days a week.  Previously he was taking 3-4 ibuprofen almost daily but has not done so in the past 3 years.  He has 3-5 mixed drinks of vodka and cranberry almost every night.  He was taking oral iron but has not been able to tolerate it due to severe epigastric discomfort and dyspepsia, hence quit taking it.  He has lost weight unintentionally due to epigastric discomfort and decreased appetite He has persistent iron deficiency anemia based on most recent labs.  Denies any rectal bleeding or melena.  No nausea or vomiting.  No dysphagia.  Colonoscopy May 12, 2019: 3 mm polyp removed from cecum.  Sigmoid diverticulosis and internal hemorrhoids  CBC Latest Ref Rng & Units 09/20/2019 09/08/2019 09/07/2019  WBC 4.0 - 10.5 K/uL 8.4 6.7 6.6  Hemoglobin 13.0 - 17.0 g/dL 8.2(L) 8.1(L) 8.4(L)  Hematocrit 39 - 52 % 27.9(L) 26.9(L) 28.5(L)  Platelets 150 - 400 K/uL 334 399 430   Iron/TIBC/Ferritin/ %Sat    Component Value Date/Time   IRON 214 (H) 09/20/2019 1519   TIBC 514 (H) 09/20/2019 1519   FERRITIN 8 (L) 09/20/2019 1519   IRONPCTSAT 42 09/20/2019 1519   CT abdomen pelvis September 22, 2019: Liver morphology suggestive of early cirrhosis, no focal liver lesions.  No other acute abnormality. No splenomegaly or findings of portal hypertension  Outpatient Encounter Medications as of 10/18/2019  Medication Sig  . aspirin EC 81 MG tablet Take 1 tablet (81 mg total) by mouth daily.  Marland Kitchen escitalopram (LEXAPRO) 5 MG tablet TAKE ONE TABLET BY MOUTH ONE TIME DAILY  . fluticasone (FLONASE) 50 MCG/ACT  nasal spray Place 2 sprays into both nostrils daily. (Patient taking differently: Place 2 sprays into both nostrils daily as needed for allergies or rhinitis. )  . ibuprofen (ADVIL,MOTRIN) 200 MG tablet Take 200 mg by mouth every 6 (six) hours as needed for moderate pain.   . meloxicam (MOBIC) 7.5 MG tablet Take 7.5 mg by mouth daily as needed for pain.  . metoprolol tartrate (LOPRESSOR) 25 MG tablet Take 1 tablet (25 mg total) by mouth 2 (two) times daily.  . Multiple Vitamins-Minerals (CENTRUM SILVER ADULT 50+) TABS Take 1 tablet by mouth daily.  . nitroGLYCERIN (NITROSTAT) 0.4 MG SL tablet Place 1 tablet (0.4 mg total) under the tongue every 5 (five) minutes as needed.  . ondansetron (ZOFRAN-ODT) 4 MG disintegrating tablet Take 1 tablet (4 mg total) by mouth every 8 (eight) hours as needed for nausea or vomiting.  . rosuvastatin (CRESTOR) 40 MG tablet Take 1 tablet (40 mg total) by mouth daily. (Patient taking differently: Take 20 mg by mouth daily. )  . [DISCONTINUED] albuterol (VENTOLIN HFA) 108 (90 Base) MCG/ACT inhaler Inhale 2 puffs into the lungs every 6 (six) hours as needed for wheezing or shortness of breath.  . [DISCONTINUED] ferrous sulfate 324 (65 Fe) MG TBEC Take by mouth.  . [DISCONTINUED] lisinopril (PRINIVIL,ZESTRIL) 20 MG tablet Take 1 tablet (20 mg total) by mouth daily.  . [DISCONTINUED] lisinopril-hydrochlorothiazide (ZESTORETIC) 20-12.5 MG tablet TAKE ONE TABLET BY MOUTH  TWICE A DAY   No facility-administered encounter medications on file as of 10/18/2019.    Allergies as of 10/18/2019  . (No Known Allergies)    Past Medical History:  Diagnosis Date  . Allergy    seasonal  . Anxiety and depression 02/07/2014  . Arthritis of knee 10/17/2016  . Cervical disc disorder with radiculopathy of cervical region 12/27/2015  . Colon cancer screening 02/07/2014  . High cholesterol   . Hyperlipidemia 12/27/2015  . Hypertension   . Intermittent explosive disorder 05/01/2014  .  Prostate cancer screening 02/19/2017  . Rash and nonspecific skin eruption 02/19/2017  . Visit for preventive health examination 07/19/2013  . Weight gain 06/17/2016    Past Surgical History:  Procedure Laterality Date  . KNEE ARTHROSCOPY W/ MENISCAL REPAIR      Family History  Problem Relation Age of Onset  . Hypertension Mother        Living  . Thyroid disease Mother   . Diabetes Father 3       Deceased  . Obesity Father   . Heart attack Father 80  . Diabetes Other        Paternal Side of Family  . Alzheimer's disease Maternal Grandmother   . Diabetes Sister   . Healthy Son        x3  . Colon cancer Neg Hx   . Rectal cancer Neg Hx   . Stomach cancer Neg Hx   . Colon polyps Neg Hx   . Esophageal cancer Neg Hx     Social History   Socioeconomic History  . Marital status: Married    Spouse name: Not on file  . Number of children: Not on file  . Years of education: Not on file  . Highest education level: Not on file  Occupational History  . Not on file  Tobacco Use  . Smoking status: Never Smoker  . Smokeless tobacco: Never Used  Vaping Use  . Vaping Use: Never used  Substance and Sexual Activity  . Alcohol use: Yes    Alcohol/week: 20.0 standard drinks    Types: 20 Standard drinks or equivalent per week  . Drug use: Yes    Types: Marijuana    Comment: occ  . Sexual activity: Not on file  Other Topics Concern  . Not on file  Social History Narrative  . Not on file   Social Determinants of Health   Financial Resource Strain:   . Difficulty of Paying Living Expenses:   Food Insecurity:   . Worried About Charity fundraiser in the Last Year:   . Arboriculturist in the Last Year:   Transportation Needs:   . Film/video editor (Medical):   Marland Kitchen Lack of Transportation (Non-Medical):   Physical Activity:   . Days of Exercise per Week:   . Minutes of Exercise per Session:   Stress:   . Feeling of Stress :   Social Connections:   . Frequency of  Communication with Friends and Family:   . Frequency of Social Gatherings with Friends and Family:   . Attends Religious Services:   . Active Member of Clubs or Organizations:   . Attends Archivist Meetings:   Marland Kitchen Marital Status:   Intimate Partner Violence:   . Fear of Current or Ex-Partner:   . Emotionally Abused:   Marland Kitchen Physically Abused:   . Sexually Abused:       Review of systems:  All other review of  systems negative except as mentioned in the HPI.   Physical Exam: Vitals:   10/18/19 0829  BP: 124/82  Pulse: 71   decreased p.o. intake.. Body mass index is 29.31 kg/m. Gen:      No acute distress HEENT:   sclera anicteric Abd:       soft, non-tender; no palpable masses, no distension Ext:    No edema Neuro: alert and oriented x 3 Psych: normal mood and affect  Data Reviewed:  Reviewed labs, radiology imaging, old records and pertinent past GI work up   Assessment and Plan/Recommendations:  54 year old male with iron deficiency anemia  He is up-to-date with colorectal cancer screening, had a diminutive adenomatous polyp removed in February 2020  We will plan to proceed with EGD to exclude possible source of upper GI blood loss, if unrevealing will need small bowel video capsule study Avoid NSAIDs  Findings of possible early cirrhosis are not recent imaging Discussed alcohol cessation Evaluate for possible esophageal varices or findings suggestive of portal hypertension on EGD Also advised patient to avoid high calorie diet or added sugar   He is not tolerating oral iron, will send referral to hematology for IV iron infusion Recheck hemoglobin and hematocrit  The risks and benefits as well as alternatives of endoscopic procedure(s) have been discussed and reviewed. All questions answered. The patient agrees to proceed.   The patient was provided an opportunity to ask questions and all were answered. The patient agreed with the plan and demonstrated  an understanding of the instructions.  Damaris Hippo , MD    CC: Shelda Pal*

## 2019-10-19 ENCOUNTER — Other Ambulatory Visit: Payer: No Typology Code available for payment source

## 2019-10-24 ENCOUNTER — Other Ambulatory Visit: Payer: Self-pay

## 2019-10-24 DIAGNOSIS — D509 Iron deficiency anemia, unspecified: Secondary | ICD-10-CM

## 2019-10-25 ENCOUNTER — Encounter: Payer: Self-pay | Admitting: Gastroenterology

## 2019-10-26 ENCOUNTER — Ambulatory Visit (INDEPENDENT_AMBULATORY_CARE_PROVIDER_SITE_OTHER): Payer: No Typology Code available for payment source

## 2019-10-26 ENCOUNTER — Other Ambulatory Visit: Payer: Self-pay | Admitting: Gastroenterology

## 2019-10-26 DIAGNOSIS — Z1159 Encounter for screening for other viral diseases: Secondary | ICD-10-CM

## 2019-10-26 LAB — SARS CORONAVIRUS 2 (TAT 6-24 HRS): SARS Coronavirus 2: NEGATIVE

## 2019-10-27 ENCOUNTER — Telehealth: Payer: Self-pay | Admitting: Family

## 2019-10-27 NOTE — Telephone Encounter (Signed)
Appointments scheduled calendar printed & mailed per 6/23

## 2019-10-30 ENCOUNTER — Encounter: Payer: Self-pay | Admitting: Gastroenterology

## 2019-10-30 ENCOUNTER — Ambulatory Visit (AMBULATORY_SURGERY_CENTER): Payer: No Typology Code available for payment source | Admitting: Gastroenterology

## 2019-10-30 VITALS — BP 116/72 | HR 54 | Temp 97.1°F | Resp 16 | Ht 72.0 in | Wt 216.0 lb

## 2019-10-30 DIAGNOSIS — K2100 Gastro-esophageal reflux disease with esophagitis, without bleeding: Secondary | ICD-10-CM

## 2019-10-30 DIAGNOSIS — D509 Iron deficiency anemia, unspecified: Secondary | ICD-10-CM | POA: Diagnosis present

## 2019-10-30 MED ORDER — SODIUM CHLORIDE 0.9 % IV SOLN: 500.0000 mL | INTRAVENOUS | Status: DC

## 2019-10-30 MED ORDER — PANTOPRAZOLE SODIUM 40 MG PO TBEC
40.0000 mg | DELAYED_RELEASE_TABLET | Freq: Every day | ORAL | 0 refills | Status: DC
Start: 2019-10-30 — End: 2019-11-03

## 2019-10-30 NOTE — Patient Instructions (Signed)
Gastroparesis Diet. Slowly advance diet as tolerated. Small frequent meals. Avoid high fat and high fiber diet. Continue present medications. Please read handouts provided. Use Protonix ( pantoprazole )  40 mg  1 tablet daily for 3 months. Follow an antireflux regimen indefinitely. Follow-up office visit in 2-3 months.      YOU HAD AN ENDOSCOPIC PROCEDURE TODAY AT [facility]:   Refer to the procedure report that was given to you for any specific questions about what was found during the examination.  If the procedure report does not answer your questions, please call your gastroenterologist to clarify.  If you requested that your care partner not be given the details of your procedure findings, then the procedure report has been included in a sealed envelope for you to review at your convenience later.  YOU SHOULD EXPECT: Some feelings of bloating in the abdomen. Passage of more gas than usual.  Walking can help get rid of the air that was put into your GI tract during the procedure and reduce the bloating. If you had a lower endoscopy (such as a colonoscopy or flexible sigmoidoscopy) you may notice spotting of blood in your stool or on the toilet paper. If you underwent a bowel prep for your procedure, you may not have a normal bowel movement for a few days.  Please Note:  You might notice some irritation and congestion in your nose or some drainage.  This is from the oxygen used during your procedure.  There is no need for concern and it should clear up in a day or so.  SYMPTOMS TO REPORT IMMEDIATELY:     Following upper endoscopy (EGD)  Vomiting of blood or coffee ground material  New chest pain or pain under the shoulder blades  Painful or persistently difficult swallowing  New shortness of breath  Fever of 100F or higher  Black, tarry-looking stools  For urgent or emergent issues, a gastroenterologist can be reached at any hour by calling [phone]. Do  not use MyChart messaging for urgent concerns.    DIET:   Drink plenty of fluids but you should avoid alcoholic beverages for 24 hours.  ACTIVITY:  You should plan to take it easy for the rest of today and you should NOT DRIVE or use heavy machinery until tomorrow (because of the sedation medicines used during the test).    FOLLOW UP: Our staff will call the number listed on your records 48-72 hours following your procedure to check on you and address any questions or concerns that you may have regarding the information given to you following your procedure. If we do not reach you, we will leave a message.  We will attempt to reach you two times.  During this call, we will ask if you have developed any symptoms of COVID 19. If you develop any symptoms (ie: fever, flu-like symptoms, shortness of breath, cough etc.) before then, please call [phone].  If you test positive for Covid 19 in the 2 weeks post procedure, please call and report this information to [place].    If any biopsies were taken you will be contacted by phone or by letter within the next 1-3 weeks.  Please call us at [phone] if you have not heard about the biopsies in 3 weeks.    SIGNATURES/CONFIDENTIALITY: You and/or your care partner have signed paperwork which will be entered into your electronic medical record.  These signatures attest to the fact that that the information above on  your After Visit Summary has been reviewed and is understood.  Full responsibility of the confidentiality of this discharge information lies with you and/or your care-partner. 

## 2019-10-30 NOTE — Progress Notes (Signed)
I reviewed with the patient his medical visits from May this year until now involving cardiology.  Pt says he was under impression from his family dr that his anemia was more of a concern than his chest pain and that he had not had any chest pain since he saw his family dr.  Works full time and exercses.  Consulted dr Silverio Decamp and she decided to get a 12 lead ekg which we did to compare to the one he had once he first sought medical care and it was the same.  Decision to proceed was ok'd by pt and dr Silverio Decamp.

## 2019-10-30 NOTE — Progress Notes (Signed)
Pt had food in stomach.  Sedation stopped.  Procedure aborted.  Waitd in procedure room till pt woke up to be near suction if needed (did not)  Report to PACU, RN, vss, BBS= Clear.

## 2019-10-30 NOTE — Op Note (Signed)
[facility] Patient Name: William Shea Procedure Date: 10/30/2019 9:21 AM MRN: [id number] Endoscopist: Mauri Pole , MD Age: 54 Referring MD:  Date of Birth: 28-Oct-1965 Gender: Male Account #: [id number] Procedure:                Upper GI endoscopy Indications:              Iron deficiency anemia due to suspected upper                            gastrointestinal bleeding Medicines:                Monitored Anesthesia Care Procedure:                Pre-Anesthesia Assessment:                           - Prior to the procedure, a History and Physical                            was performed, and patient medications and                            allergies were reviewed. The patient's tolerance of                            previous anesthesia was also reviewed. The risks                            and benefits of the procedure and the sedation                            options and risks were discussed with the patient.                            All questions were answered, and informed consent                            was obtained. Prior Anticoagulants: The patient has                            taken no previous anticoagulant or antiplatelet                            agents. ASA Grade Assessment: II - A patient with                            mild systemic disease. After reviewing the risks                            and benefits, the patient was deemed in                            satisfactory condition to undergo the procedure.  After obtaining informed consent, the endoscope was                            passed under direct vision. Throughout the                            procedure, the patient's blood pressure, pulse, and                            oxygen saturations were monitored continuously. The                            Endoscope was introduced through the mouth, and                            advanced to the second part of  duodenum. The upper                            GI endoscopy was technically difficult and complex                            due to presence of bezoar. The patient tolerated                            the procedure well. Scope In: Scope Out: Findings:                 LA Grade B (one or more mucosal breaks greater than                            5 mm, not extending between the tops of two mucosal                            folds) esophagitis with no bleeding was found 36 to                            38 cm from the incisors.                           The gastroesophageal flap valve was visualized                            endoscopically and classified as Hill Grade IV (no                            fold, wide open lumen, hiatal hernia present).                           A large amount of a phytobezoar was found in the                            gastric body and in the gastric antrum.  Food (residue) was found in the duodenal bulb. Complications:            No immediate complications. Estimated Blood Loss:     Estimated blood loss: none. Impression:               - LA Grade B reflux esophagitis with no bleeding.                           - Gastroesophageal flap valve classified as Hill                            Grade IV (no fold, wide open lumen, hiatal hernia                            present).                           - A large amount of food residue and phytobezoar in                            the stomach. No evidence of gastric outlet                            obsruction. Limited visualization of the gastric                            mucosa                           - Retained food and phytobezoar in the duodenum. no                            evidence obstruction                           - No specimens collected. Recommendation:           - Gastroparesis diet. Slowly advance diet as                            tolerated. Small frequent meals. Avoid  high fat and                            high fiber diet.                           - Continue present medications.                           - Use Protonix (pantoprazole) 40 mg PO daily X3                            months.                           - Follow an antireflux regimen indefinitely.                           -  Schedule 4 hour gastric emptying scan to evaluate                            for gastroparesis                           - Follow up office visit in 2-3 months and will                            plan to schedule repeat EGD Mauri Pole, MD 10/30/2019 9:40:50 AM This report has been signed electronically.

## 2019-10-30 NOTE — Progress Notes (Signed)
Vs SP 

## 2019-10-31 ENCOUNTER — Telehealth: Payer: Self-pay

## 2019-10-31 ENCOUNTER — Telehealth: Payer: Self-pay | Admitting: *Deleted

## 2019-10-31 ENCOUNTER — Other Ambulatory Visit: Payer: Self-pay

## 2019-10-31 DIAGNOSIS — T189XXA Foreign body of alimentary tract, part unspecified, initial encounter: Secondary | ICD-10-CM

## 2019-10-31 DIAGNOSIS — K3184 Gastroparesis: Secondary | ICD-10-CM

## 2019-10-31 DIAGNOSIS — R1012 Left upper quadrant pain: Secondary | ICD-10-CM

## 2019-10-31 NOTE — Telephone Encounter (Signed)
Message received from patient stating that his GI MD would like for him to get set up for IV iron and would like to make an appt.  Call placed back to patient and message left to inform him to call office back for instructions regarding need for current lab work.

## 2019-10-31 NOTE — Telephone Encounter (Signed)
Per procedure report from 10/30/19, patient is scheduled for gastric emptying scan on 11/08/19 at 7:00 am Arrive to [facility] at 6:45 am. NPO after midnight. No stomach medications after midnight.  Called the patient. No answer. Left a message to call back for his appointment and instructions.

## 2019-11-01 ENCOUNTER — Ambulatory Visit: Payer: No Typology Code available for payment source | Admitting: Physician Assistant

## 2019-11-01 ENCOUNTER — Other Ambulatory Visit: Payer: Self-pay | Admitting: *Deleted

## 2019-11-01 ENCOUNTER — Telehealth: Payer: Self-pay

## 2019-11-01 DIAGNOSIS — D649 Anemia, unspecified: Secondary | ICD-10-CM

## 2019-11-01 NOTE — Telephone Encounter (Signed)
William Shea I think this is your patient. Thanks

## 2019-11-01 NOTE — Telephone Encounter (Signed)
Pt is wanting to cancel the gastric emptying scan for now. Pt would like to discuss this further with [facility]

## 2019-11-01 NOTE — Telephone Encounter (Signed)
Patient wishes to cancel the the GES.  He does not have any additional time off from work due to his wife illness and his own.  He will call back if his symptoms are worsening to reschedule at a later date.  Appt cancelled with radiology.

## 2019-11-01 NOTE — Telephone Encounter (Signed)
First post procedure follow up call, no answer 

## 2019-11-01 NOTE — Telephone Encounter (Signed)
Thanks Sheri for letting me know, can you please advise him to schedule follow up visit next available appt in 6-8 weeks. Thanks

## 2019-11-02 ENCOUNTER — Other Ambulatory Visit: Payer: No Typology Code available for payment source

## 2019-11-02 NOTE — Telephone Encounter (Signed)
I left a message for the patient to please call and make an office visit appt at his next convenience.

## 2019-11-03 ENCOUNTER — Inpatient Hospital Stay: Payer: No Typology Code available for payment source | Attending: Hematology & Oncology

## 2019-11-03 ENCOUNTER — Other Ambulatory Visit: Payer: Self-pay

## 2019-11-03 ENCOUNTER — Inpatient Hospital Stay (HOSPITAL_BASED_OUTPATIENT_CLINIC_OR_DEPARTMENT_OTHER): Payer: No Typology Code available for payment source | Admitting: Family

## 2019-11-03 ENCOUNTER — Encounter: Payer: Self-pay | Admitting: Family

## 2019-11-03 VITALS — BP 133/77 | HR 65 | Temp 98.2°F | Resp 17 | Ht 72.0 in | Wt 217.4 lb

## 2019-11-03 DIAGNOSIS — D509 Iron deficiency anemia, unspecified: Secondary | ICD-10-CM | POA: Insufficient documentation

## 2019-11-03 DIAGNOSIS — D500 Iron deficiency anemia secondary to blood loss (chronic): Secondary | ICD-10-CM

## 2019-11-03 DIAGNOSIS — D649 Anemia, unspecified: Secondary | ICD-10-CM

## 2019-11-03 LAB — CBC WITH DIFFERENTIAL (CANCER CENTER ONLY)
Abs Immature Granulocytes: 0.02 10*3/uL (ref 0.00–0.07)
Basophils Absolute: 0.1 10*3/uL (ref 0.0–0.1)
Basophils Relative: 2 %
Eosinophils Absolute: 0.3 10*3/uL (ref 0.0–0.5)
Eosinophils Relative: 4 %
HCT: 34.3 % — ABNORMAL LOW (ref 39.0–52.0)
Hemoglobin: 10.5 g/dL — ABNORMAL LOW (ref 13.0–17.0)
Immature Granulocytes: 0 %
Lymphocytes Relative: 29 %
Lymphs Abs: 2.2 10*3/uL (ref 0.7–4.0)
MCH: 24 pg — ABNORMAL LOW (ref 26.0–34.0)
MCHC: 30.6 g/dL (ref 30.0–36.0)
MCV: 78.5 fL — ABNORMAL LOW (ref 80.0–100.0)
Monocytes Absolute: 0.7 10*3/uL (ref 0.1–1.0)
Monocytes Relative: 10 %
Neutro Abs: 4.1 10*3/uL (ref 1.7–7.7)
Neutrophils Relative %: 55 %
Platelet Count: 401 10*3/uL — ABNORMAL HIGH (ref 150–400)
RBC: 4.37 MIL/uL (ref 4.22–5.81)
RDW: 21.1 % — ABNORMAL HIGH (ref 11.5–15.5)
WBC Count: 7.5 10*3/uL (ref 4.0–10.5)
nRBC: 0 % (ref 0.0–0.2)

## 2019-11-03 NOTE — Progress Notes (Signed)
Hematology and Oncology Follow Up Visit  William Shea [id number] [birth year] 54 y.o. 11/03/2019   Principle Diagnosis:  Iron deficiency anemia   Current Therapy:   IV iron as indicated    Interim History:  William Shea is here today for follow-up. He is symptomatic with increased fatigue, weakness and SOB with over exertion.  He had to stop the oral iron supplement earlier this month due to GI upset and constipation.  He had an endoscopy last week which showed some esophagitis and a hiatal hernia. He had slow gastric emptying and they recommended a gastric emptying study but he is out of vacation days right now.  He is following a gastroparesis diet and taking a probiotic and protonix daily.  He has not noted any blood loss. No bruising or petechiae.  No fever, chills, n/v, cough, rash, dizziness, chest pain, palpitations, abdominal pain or changes in bowel or bladder habits.   No swelling, tenderness, numbness or tingling in her extremities.  No falls or syncope.  He has a good appetite and is staying well hydrated. His weight is stable.   ECOG Performance Status: 1 - Symptomatic but completely ambulatory  Medications:  Allergies as of 11/03/2019   No Known Allergies     Medication List       Accurate as of November 03, 2019  1:13 PM. If you have any questions, ask your nurse or doctor.        aspirin EC 81 MG tablet Take 1 tablet (81 mg total) by mouth daily.   Centrum Silver Adult 50+ Tabs Take 1 tablet by mouth daily.   escitalopram 5 MG tablet Commonly known as: LEXAPRO TAKE ONE TABLET BY MOUTH ONE TIME DAILY   fluticasone 50 MCG/ACT nasal spray Commonly known as: FLONASE Place 2 sprays into both nostrils daily.   ibuprofen 200 MG tablet Commonly known as: ADVIL Take 200 mg by mouth every 6 (six) hours as needed for moderate pain.   lisinopril-hydrochlorothiazide 20-12.5 MG tablet Commonly known as: ZESTORETIC Take 1 tablet by mouth daily.   nitroGLYCERIN 0.4  MG SL tablet Commonly known as: NITROSTAT Place 1 tablet (0.4 mg total) under the tongue every 5 (five) minutes as needed.   ondansetron 4 MG disintegrating tablet Commonly known as: ZOFRAN-ODT Take 1 tablet (4 mg total) by mouth every 8 (eight) hours as needed for nausea or vomiting.   pantoprazole 40 MG tablet Commonly known as: PROTONIX Take 1 tablet (40 mg total) by mouth daily.   rosuvastatin 40 MG tablet Commonly known as: CRESTOR Take 1 tablet (40 mg total) by mouth daily. What changed: how much to take       Allergies: No Known Allergies  Past Medical History, Surgical history, Social history, and Family History were reviewed and updated.  Review of Systems: All other 10 point review of systems is negative.   Physical Exam:  vitals were not taken for this visit.   Wt Readings from Last 3 Encounters:  10/30/19 (!) 216 lb (98 kg)  10/18/19 216 lb 2 oz (98 kg)  09/20/19 222 lb (100.7 kg)    Ocular: Sclerae unicteric, pupils equal, round and reactive to light Ear-nose-throat: Oropharynx clear, dentition fair Lymphatic: No cervical or supraclavicular adenopathy Lungs no rales or rhonchi, good excursion bilaterally Heart regular rate and rhythm, no murmur appreciated Abd soft, nontender, positive bowel sounds, no liver or spleen tip palpated on exam, no fluid wave  MSK no focal spinal tenderness, no joint edema Neuro: non-focal,  well-oriented, appropriate affect Breasts: Deferred   Lab Results  Component Value Date   WBC 7.5 11/03/2019   HGB 10.5 (L) 11/03/2019   HCT 34.3 (L) 11/03/2019   MCV 78.5 (L) 11/03/2019   PLT 401 (H) 11/03/2019   Lab Results  Component Value Date   FERRITIN 8 (L) 09/20/2019   IRON 214 (H) 09/20/2019   TIBC 514 (H) 09/20/2019   UIBC 300 09/20/2019   IRONPCTSAT 42 09/20/2019   Lab Results  Component Value Date   RETICCTPCT 2.9 09/20/2019   RBC 4.37 11/03/2019   No results found for: KPAFRELGTCHN, LAMBDASER, KAPLAMBRATIO No  results found for: IGGSERUM, IGA, IGMSERUM No results found for: Ronnald Ramp, A1GS, A2GS, Violet Baldy, MSPIKE, SPEI   Chemistry      Component Value Date/Time   NA 131 (L) 09/20/2019 1519   NA 137 08/21/2019 1536   K 4.2 09/20/2019 1519   CL 97 (L) 09/20/2019 1519   CO2 24 09/20/2019 1519   BUN 20 09/20/2019 1519   BUN 17 08/21/2019 1536   CREATININE 0.90 09/20/2019 1519   CREATININE 0.97 08/30/2013 0910      Component Value Date/Time   CALCIUM 9.0 09/20/2019 1519   ALKPHOS 76 09/20/2019 1519   AST 47 (H) 09/20/2019 1519   ALT 38 09/20/2019 1519   BILITOT 0.5 09/20/2019 1519       Impression and Plan: William Shea is a very pleasant 54 yo caucasian gentleman with iron deficiency anemia felt to be due to intermittent GI blood loss.  He failed oral iron. We will see what his iron studies show and bring him back in for replacement if needed.  We will plan to see him again in 6 weeks.  He can contact our office with any questions or concerns. We can certainly see her sooner if needed.   William Peace, NP 7/30/20211:13 PM

## 2019-11-06 LAB — IRON AND TIBC
Iron: 16 ug/dL — ABNORMAL LOW (ref 42–163)
Saturation Ratios: 4 % — ABNORMAL LOW (ref 20–55)
TIBC: 449 ug/dL — ABNORMAL HIGH (ref 202–409)
UIBC: 433 ug/dL — ABNORMAL HIGH (ref 117–376)

## 2019-11-06 LAB — FERRITIN: Ferritin: 9 ng/mL — ABNORMAL LOW (ref 24–336)

## 2019-11-07 ENCOUNTER — Other Ambulatory Visit: Payer: Self-pay | Admitting: Family

## 2019-11-07 DIAGNOSIS — D500 Iron deficiency anemia secondary to blood loss (chronic): Secondary | ICD-10-CM | POA: Insufficient documentation

## 2019-11-07 HISTORY — DX: Iron deficiency anemia secondary to blood loss (chronic): D50.0

## 2019-11-08 ENCOUNTER — Ambulatory Visit (HOSPITAL_COMMUNITY): Payer: No Typology Code available for payment source

## 2019-11-13 ENCOUNTER — Inpatient Hospital Stay: Payer: No Typology Code available for payment source | Attending: Hematology & Oncology

## 2019-11-13 ENCOUNTER — Other Ambulatory Visit: Payer: Self-pay

## 2019-11-13 VITALS — BP 133/69 | HR 62 | Temp 98.6°F | Resp 18

## 2019-11-13 DIAGNOSIS — D509 Iron deficiency anemia, unspecified: Secondary | ICD-10-CM | POA: Insufficient documentation

## 2019-11-13 DIAGNOSIS — D500 Iron deficiency anemia secondary to blood loss (chronic): Secondary | ICD-10-CM

## 2019-11-13 MED ORDER — SODIUM CHLORIDE 0.9 % IV SOLN
Freq: Once | INTRAVENOUS | Status: AC
  Filled 2019-11-13: qty 250

## 2019-11-13 MED ORDER — SODIUM CHLORIDE 0.9 % IV SOLN
200.0000 mg | Freq: Once | INTRAVENOUS | Status: AC
  Administered 2019-11-13: 200 mg via INTRAVENOUS
  Filled 2019-11-13: qty 200

## 2019-11-13 NOTE — Patient Instructions (Signed)
Iron Sucrose injection (Venofer)  What is this medicine? IRON SUCROSE (AHY ern SOO krohs) is an iron complex. Iron is used to make healthy red blood cells, which carry oxygen and nutrients throughout the body. This medicine is used to treat iron deficiency anemia in people with chronic kidney disease. This medicine may be used for other purposes; ask your health care provider or pharmacist if you have questions. COMMON BRAND NAME(S): Venofer What should I tell my health care provider before I take this medicine? They need to know if you have any of these conditions:  anemia not caused by low iron levels  heart disease  high levels of iron in the blood  kidney disease  liver disease  an unusual or allergic reaction to iron, other medicines, foods, dyes, or preservatives  pregnant or trying to get pregnant  breast-feeding How should I use this medicine? This medicine is for infusion into a vein. It is given by a health care professional in a hospital or clinic setting. Talk to your pediatrician regarding the use of this medicine in children. While this drug may be prescribed for children as young as 2 years for selected conditions, precautions do apply. Overdosage: If you think you have taken too much of this medicine contact a poison control center or emergency room at once. NOTE: This medicine is only for you. Do not share this medicine with others. What if I miss a dose? It is important not to miss your dose. Call your doctor or health care professional if you are unable to keep an appointment. What may interact with this medicine? Do not take this medicine with any of the following medications:  deferoxamine  dimercaprol  other iron products This medicine may also interact with the following medications:  chloramphenicol  deferasirox This list may not describe all possible interactions. Give your health care provider a list of all the medicines, herbs, non-prescription  drugs, or dietary supplements you use. Also tell them if you smoke, drink alcohol, or use illegal drugs. Some items may interact with your medicine. What should I watch for while using this medicine? Visit your doctor or healthcare professional regularly. Tell your doctor or healthcare professional if your symptoms do not start to get better or if they get worse. You may need blood work done while you are taking this medicine. You may need to follow a special diet. Talk to your doctor. Foods that contain iron include: whole grains/cereals, dried fruits, beans, or peas, leafy green vegetables, and organ meats (liver, kidney). What side effects may I notice from receiving this medicine? Side effects that you should report to your doctor or health care professional as soon as possible:  allergic reactions like skin rash, itching or hives, swelling of the face, lips, or tongue  breathing problems  changes in blood pressure  cough  fast, irregular heartbeat  feeling faint or lightheaded, falls  fever or chills  flushing, sweating, or hot feelings  joint or muscle aches/pains  seizures  swelling of the ankles or feet  unusually weak or tired Side effects that usually do not require medical attention (report to your doctor or health care professional if they continue or are bothersome):  diarrhea  feeling achy  headache  irritation at site where injected  nausea, vomiting  stomach upset  tiredness This list may not describe all possible side effects. Call your doctor for medical advice about side effects. You may report side effects to FDA at 1-800-FDA-1088. Where should   I keep my medicine? This drug is given in a hospital or clinic and will not be stored at home. NOTE: This sheet is a summary. It may not cover all possible information. If you have questions about this medicine, talk to your doctor, pharmacist, or health care provider.  2020 Elsevier/Gold Standard  (2011-01-01 17:14:35)  

## 2019-11-16 ENCOUNTER — Other Ambulatory Visit: Payer: Self-pay

## 2019-11-16 ENCOUNTER — Inpatient Hospital Stay: Payer: No Typology Code available for payment source

## 2019-11-16 VITALS — BP 123/61 | HR 62 | Temp 98.9°F | Resp 18

## 2019-11-16 DIAGNOSIS — D500 Iron deficiency anemia secondary to blood loss (chronic): Secondary | ICD-10-CM

## 2019-11-16 DIAGNOSIS — D509 Iron deficiency anemia, unspecified: Secondary | ICD-10-CM | POA: Diagnosis not present

## 2019-11-16 MED ORDER — SODIUM CHLORIDE 0.9 % IV SOLN
200.0000 mg | Freq: Once | INTRAVENOUS | Status: AC
  Administered 2019-11-16: 200 mg via INTRAVENOUS
  Filled 2019-11-16: qty 10

## 2019-11-16 MED ORDER — SODIUM CHLORIDE 0.9 % IV SOLN
INTRAVENOUS | Status: DC
  Filled 2019-11-16: qty 250

## 2019-11-16 MED ORDER — SODIUM CHLORIDE 0.9 % IV SOLN
Freq: Once | INTRAVENOUS | Status: DC
  Filled 2019-11-16: qty 250

## 2019-11-16 NOTE — Patient Instructions (Signed)

## 2019-11-20 ENCOUNTER — Other Ambulatory Visit: Payer: Self-pay

## 2019-11-20 ENCOUNTER — Inpatient Hospital Stay: Payer: No Typology Code available for payment source

## 2019-11-20 VITALS — BP 124/73 | HR 58 | Temp 98.6°F | Resp 17

## 2019-11-20 DIAGNOSIS — D509 Iron deficiency anemia, unspecified: Secondary | ICD-10-CM | POA: Diagnosis not present

## 2019-11-20 DIAGNOSIS — D500 Iron deficiency anemia secondary to blood loss (chronic): Secondary | ICD-10-CM

## 2019-11-20 MED ORDER — SODIUM CHLORIDE 0.9 % IV SOLN
Freq: Once | INTRAVENOUS | Status: AC
  Filled 2019-11-20: qty 250

## 2019-11-20 MED ORDER — SODIUM CHLORIDE 0.9 % IV SOLN
200.0000 mg | Freq: Once | INTRAVENOUS | Status: AC
  Administered 2019-11-20: 200 mg via INTRAVENOUS
  Filled 2019-11-20: qty 200

## 2019-11-20 NOTE — Patient Instructions (Signed)

## 2019-11-22 ENCOUNTER — Ambulatory Visit: Payer: No Typology Code available for payment source | Admitting: Family

## 2019-11-22 ENCOUNTER — Other Ambulatory Visit: Payer: No Typology Code available for payment source

## 2019-11-23 ENCOUNTER — Other Ambulatory Visit: Payer: Self-pay

## 2019-11-23 ENCOUNTER — Inpatient Hospital Stay: Payer: No Typology Code available for payment source

## 2019-11-23 VITALS — BP 121/73 | HR 67 | Temp 98.8°F | Resp 17

## 2019-11-23 DIAGNOSIS — D500 Iron deficiency anemia secondary to blood loss (chronic): Secondary | ICD-10-CM

## 2019-11-23 DIAGNOSIS — D509 Iron deficiency anemia, unspecified: Secondary | ICD-10-CM | POA: Diagnosis not present

## 2019-11-23 MED ORDER — SODIUM CHLORIDE 0.9 % IV SOLN
Freq: Once | INTRAVENOUS | Status: AC
  Filled 2019-11-23: qty 250

## 2019-11-23 MED ORDER — SODIUM CHLORIDE 0.9 % IV SOLN
200.0000 mg | Freq: Once | INTRAVENOUS | Status: AC
  Administered 2019-11-23: 200 mg via INTRAVENOUS
  Filled 2019-11-23: qty 200

## 2019-11-23 NOTE — Patient Instructions (Signed)

## 2019-11-23 NOTE — Progress Notes (Signed)
Patient declined to stay for the post infusion observation period. Patient denies any difficulty with this infusion in the past and is aware to call with any questions or concerns.   Pt verbalized understanding and had no further questions today.   

## 2019-11-27 ENCOUNTER — Other Ambulatory Visit: Payer: Self-pay

## 2019-11-27 ENCOUNTER — Inpatient Hospital Stay: Payer: No Typology Code available for payment source

## 2019-11-27 VITALS — BP 122/64 | HR 70 | Temp 98.8°F | Resp 18

## 2019-11-27 DIAGNOSIS — D509 Iron deficiency anemia, unspecified: Secondary | ICD-10-CM | POA: Diagnosis not present

## 2019-11-27 DIAGNOSIS — D500 Iron deficiency anemia secondary to blood loss (chronic): Secondary | ICD-10-CM

## 2019-11-27 MED ORDER — SODIUM CHLORIDE 0.9 % IV SOLN
Freq: Once | INTRAVENOUS | Status: AC
  Filled 2019-11-27: qty 250

## 2019-11-27 MED ORDER — SODIUM CHLORIDE 0.9 % IV SOLN
200.0000 mg | Freq: Once | INTRAVENOUS | Status: AC
  Administered 2019-11-27: 200 mg via INTRAVENOUS
  Filled 2019-11-27: qty 200

## 2019-11-27 NOTE — Patient Instructions (Signed)

## 2019-11-30 ENCOUNTER — Other Ambulatory Visit: Payer: Self-pay | Admitting: Cardiology

## 2019-11-30 DIAGNOSIS — E785 Hyperlipidemia, unspecified: Secondary | ICD-10-CM

## 2019-12-05 ENCOUNTER — Ambulatory Visit: Payer: No Typology Code available for payment source | Admitting: Cardiology

## 2019-12-15 ENCOUNTER — Inpatient Hospital Stay (HOSPITAL_BASED_OUTPATIENT_CLINIC_OR_DEPARTMENT_OTHER): Payer: No Typology Code available for payment source | Admitting: Family

## 2019-12-15 ENCOUNTER — Encounter: Payer: Self-pay | Admitting: Family

## 2019-12-15 ENCOUNTER — Inpatient Hospital Stay: Payer: No Typology Code available for payment source | Attending: Hematology & Oncology

## 2019-12-15 ENCOUNTER — Other Ambulatory Visit: Payer: Self-pay

## 2019-12-15 VITALS — BP 136/81 | HR 69 | Temp 98.5°F | Resp 18 | Ht 72.0 in | Wt 205.1 lb

## 2019-12-15 DIAGNOSIS — D509 Iron deficiency anemia, unspecified: Secondary | ICD-10-CM | POA: Insufficient documentation

## 2019-12-15 DIAGNOSIS — D500 Iron deficiency anemia secondary to blood loss (chronic): Secondary | ICD-10-CM

## 2019-12-15 LAB — CBC WITH DIFFERENTIAL (CANCER CENTER ONLY)
Abs Immature Granulocytes: 0.04 10*3/uL (ref 0.00–0.07)
Basophils Absolute: 0.1 10*3/uL (ref 0.0–0.1)
Basophils Relative: 1 %
Eosinophils Absolute: 0.3 10*3/uL (ref 0.0–0.5)
Eosinophils Relative: 3 %
HCT: 38.6 % — ABNORMAL LOW (ref 39.0–52.0)
Hemoglobin: 12.6 g/dL — ABNORMAL LOW (ref 13.0–17.0)
Immature Granulocytes: 1 %
Lymphocytes Relative: 21 %
Lymphs Abs: 1.7 10*3/uL (ref 0.7–4.0)
MCH: 27.1 pg (ref 26.0–34.0)
MCHC: 32.6 g/dL (ref 30.0–36.0)
MCV: 83 fL (ref 80.0–100.0)
Monocytes Absolute: 0.6 10*3/uL (ref 0.1–1.0)
Monocytes Relative: 7 %
Neutro Abs: 5.7 10*3/uL (ref 1.7–7.7)
Neutrophils Relative %: 67 %
Platelet Count: 289 10*3/uL (ref 150–400)
RBC: 4.65 MIL/uL (ref 4.22–5.81)
RDW: 19.6 % — ABNORMAL HIGH (ref 11.5–15.5)
WBC Count: 8.5 10*3/uL (ref 4.0–10.5)
nRBC: 0 % (ref 0.0–0.2)

## 2019-12-15 LAB — RETICULOCYTES
Immature Retic Fract: 9.3 % (ref 2.3–15.9)
RBC.: 4.64 MIL/uL (ref 4.22–5.81)
Retic Count, Absolute: 50.6 10*3/uL (ref 19.0–186.0)
Retic Ct Pct: 1.1 % (ref 0.4–3.1)

## 2019-12-15 NOTE — Progress Notes (Signed)
Hematology and Oncology Follow Up Visit  William Shea [id number] 14-Oct-1965 54 y.o. 12/15/2019   Principle Diagnosis:  Iron deficiency anemia  Current Therapy:        IV iron as indicated    Interim History:  William Shea is here today for follow-up. He did well with IV iron infusions and is feeling much better. His symptoms have resolved.  He has not noted any blood loss. No bruising or petechiae.  Hgb is up to 12.6, MCV 83 and retic count 1.1%.  No fever, chills, n/v, cough, rash, dizziness, SOB, chest pain, palpitations, abdominal pain or changes in bowel or bladder habits.  No swelling, tenderness, numbness or tingling in his extremities.  No falls or syncope.  He has maintained a good appetite and is staying well hydrated. His weight is stable.   ECOG Performance Status: 1 - Symptomatic but completely ambulatory  Medications:  Allergies as of 12/15/2019   No Known Allergies     Medication List       Accurate as of December 15, 2019 11:55 AM. If you have any questions, ask your nurse or doctor.        aspirin EC 81 MG tablet Take 1 tablet (81 mg total) by mouth daily.   Centrum Silver Adult 50+ Tabs Take 1 tablet by mouth daily.   escitalopram 5 MG tablet Commonly known as: LEXAPRO TAKE ONE TABLET BY MOUTH ONE TIME DAILY   fluticasone 50 MCG/ACT nasal spray Commonly known as: FLONASE Place 2 sprays into both nostrils daily.   ibuprofen 200 MG tablet Commonly known as: ADVIL Take 200 mg by mouth every 6 (six) hours as needed for moderate pain.   lisinopril-hydrochlorothiazide 20-12.5 MG tablet Commonly known as: ZESTORETIC Take 1 tablet by mouth in the morning and at bedtime.   nitroGLYCERIN 0.4 MG SL tablet Commonly known as: NITROSTAT Place 1 tablet (0.4 mg total) under the tongue every 5 (five) minutes as needed.   rosuvastatin 40 MG tablet Commonly known as: CRESTOR TAKE ONE TABLET BY MOUTH ONE TIME DAILY       Allergies: No Known  Allergies  Past Medical History, Surgical history, Social history, and Family History were reviewed and updated.  Review of Systems: All other 10 point review of systems is negative.   Physical Exam:  height is 6' (1.829 m) and weight is 205 lb 1.3 oz (93 kg). His oral temperature is 98.5 F (36.9 C). His blood pressure is 136/81 and his pulse is 69. His respiration is 18 and oxygen saturation is 100%.   Wt Readings from Last 3 Encounters:  12/15/19 205 lb 1.3 oz (93 kg)  11/03/19 (!) 217 lb 6.4 oz (98.6 kg)  10/30/19 (!) 216 lb (98 kg)    Ocular: Sclerae unicteric, pupils equal, round and reactive to light Ear-nose-throat: Oropharynx clear, dentition fair Lymphatic: No cervical or supraclavicular adenopathy Lungs no rales or rhonchi, good excursion bilaterally Heart regular rate and rhythm, no murmur appreciated Abd soft, nontender, positive bowel sounds, no liver or spleen tip palpated on exam, no fluid wave  MSK no focal spinal tenderness, no joint edema Neuro: non-focal, well-oriented, appropriate affect Breasts: Deferred   Lab Results  Component Value Date   WBC 8.5 12/15/2019   HGB 12.6 (L) 12/15/2019   HCT 38.6 (L) 12/15/2019   MCV 83.0 12/15/2019   PLT 289 12/15/2019   Lab Results  Component Value Date   FERRITIN 9 (L) 11/03/2019   IRON 16 (L) 11/03/2019  TIBC 449 (H) 11/03/2019   UIBC 433 (H) 11/03/2019   IRONPCTSAT 4 (L) 11/03/2019   Lab Results  Component Value Date   RETICCTPCT 1.1 12/15/2019   RBC 4.64 12/15/2019   RBC 4.65 12/15/2019   No results found for: KPAFRELGTCHN, LAMBDASER, KAPLAMBRATIO No results found for: IGGSERUM, IGA, IGMSERUM No results found for: Ronnald Ramp, A1GS, A2GS, Violet Baldy, MSPIKE, SPEI   Chemistry      Component Value Date/Time   NA 131 (L) 09/20/2019 1519   NA 137 08/21/2019 1536   K 4.2 09/20/2019 1519   CL 97 (L) 09/20/2019 1519   CO2 24 09/20/2019 1519   BUN 20 09/20/2019 1519   BUN 17  08/21/2019 1536   CREATININE 0.90 09/20/2019 1519   CREATININE 0.97 08/30/2013 0910      Component Value Date/Time   CALCIUM 9.0 09/20/2019 1519   ALKPHOS 76 09/20/2019 1519   AST 47 (H) 09/20/2019 1519   ALT 38 09/20/2019 1519   BILITOT 0.5 09/20/2019 1519       Impression and Plan: William Shea is a very pleasant 54 yo caucasian gentleman withiron deficiency anemia felt to be due to intermittent GI blood loss.  He has responded nicely to IV iron and is asymptomatic at this time.  Iron studies redrawn today and results are pending. We will replace if needed.  We will plan to see him again in 3 months.  He can contact our office with any questions or concerns.   Laverna Peace, NP 9/10/202111:55 AM

## 2019-12-18 LAB — IRON AND TIBC
Iron: 134 ug/dL (ref 42–163)
Saturation Ratios: 39 % (ref 20–55)
TIBC: 341 ug/dL (ref 202–409)
UIBC: 207 ug/dL (ref 117–376)

## 2019-12-18 LAB — FERRITIN: Ferritin: 272 ng/mL (ref 24–336)

## 2020-01-10 ENCOUNTER — Encounter: Payer: Self-pay | Admitting: *Deleted

## 2020-01-23 ENCOUNTER — Encounter: Payer: Self-pay | Admitting: Family Medicine

## 2020-01-23 ENCOUNTER — Ambulatory Visit: Payer: No Typology Code available for payment source | Admitting: Family Medicine

## 2020-01-23 ENCOUNTER — Other Ambulatory Visit: Payer: Self-pay

## 2020-01-23 VITALS — BP 108/68 | HR 70 | Temp 98.1°F | Ht 71.0 in | Wt 200.1 lb

## 2020-01-23 DIAGNOSIS — F4323 Adjustment disorder with mixed anxiety and depressed mood: Secondary | ICD-10-CM

## 2020-01-23 MED ORDER — ESCITALOPRAM OXALATE 10 MG PO TABS: 10.0000 mg | ORAL_TABLET | Freq: Every day | ORAL | 2 refills | Status: DC

## 2020-01-23 NOTE — Patient Instructions (Addendum)
Please consider counseling. Contact [phone] to schedule an appointment or inquire about cost/insurance coverage. Request a psychologist when scheduling.  Aim to do some physical exertion for 150 minutes per week. This is typically divided into 5 days per week, 30 minutes per day. The activity should be enough to get your heart rate up. Anything is better than nothing if you have time constraints.  Let us know if you need anything.  Consider going to [facility] if you feel like you might harm yourself.  Drop of weapons at a friend's house until this passes.  [place], [place]   Suicidal Feelings: How to Help Yourself Suicide is when you end your own life. There are many things you can do to help yourself feel better when struggling with these feelings. Many services and people are available to support you and others who struggle with similar feelings.  If you ever feel like you may hurt yourself or others, or have thoughts about taking your own life, get help right away. To get help:  Call your local emergency services (911 in the [place]  [facility] [place] [facility]'s health and human services helpline (211 in the [place]  Go to your nearest emergency department.  Call a suicide hotline to speak with a trained counselor. The following suicide hotlines are available in the [place]: ? 1-800-273-TALK 405 640 2253). ? 1-800-SUICIDE (661)050-1439). ? (442) 168-1642. This is a hotline for Spanish speakers. ? 580-047-5979. This is a hotline for TTY users. ? 1-866-4-U-TREVOR 984 015 6436). This is a hotline for lesbian, gay, bisexual, transgender, or questioning youth. ? For a list of hotlines in [place], visit ParkingAffiliatePrograms.se.html  Contact a crisis center or a local suicide prevention center. To find a crisis center or suicide prevention center: ? Call your local hospital,  clinic, community service organization, mental health center, social service provider, or health department. Ask for help with connecting to a crisis center. ? For a list of crisis centers in the [place], visit: suicidepreventionlifeline.org ? For a list of crisis centers in [place], visit: suicideprevention.ca How to help yourself feel better   Promise yourself that you will not do anything extreme when you have suicidal feelings. Remember, there is hope. Many people have gotten through suicidal thoughts and feelings, and you can too. If you have had these feelings before, remind yourself that you can get through them again.  Let family, friends, teachers, or counselors know how you are feeling. Try not to separate yourself from those who care about you and want to help you. Talk with someone every day, even if you do not feel sociable. Face-to-face conversation is best to help them understand your feelings.  Contact a mental health care provider and work with this person regularly.  Make a safety plan that you can follow during a crisis. Include phone numbers of suicide prevention hotlines, mental health professionals, and trusted friends and family members you can call during an emergency. Save these numbers on your phone.  If you are thinking of taking a lot of medicine, give your medicine to someone who can give it to you as prescribed. If you are on antidepressants and are concerned you will overdose, tell your health care provider so that he or she can give you safer medicines.  Try to stick to your routines. Follow a schedule every day. Make self-care a priority.  Make a list of realistic goals, and cross them off when you achieve them. Accomplishments can give  you a sense of worth.  Wait until you are feeling better before doing things that you find difficult or unpleasant.  Do things that you have always enjoyed to take your mind off your feelings. Try reading a book, or  listening to or playing music. Spending time outside, in nature, may help you feel better. Follow these instructions at home:   Visit your primary health care provider every year for a checkup.  Work with a mental health care provider as needed.  Eat a well-balanced diet, and eat regular meals.  Get plenty of rest.  Exercise if you are able. Just 30 minutes of exercise each day can help you feel better.  Take over-the-counter and prescription medicines only as told by your health care provider. Ask your mental health care provider about the possible side effects of any medicines you are taking.  Do not use alcohol or drugs, and remove these substances from your home.  Remove weapons, poisons, knives, and other deadly items from your home. General recommendations  Keep your living space well lit.  When you are feeling well, write yourself a letter with tips and support that you can read when you are not feeling well.  Remember that life's difficulties can be sorted out with help. Conditions can be treated, and you can learn behaviors and ways of thinking that will help you. Where to find more information  National Suicide Prevention Lifeline: www.suicidepreventionlifeline.org  Hopeline: www.hopeline.[place] for Suicide Prevention: PromotionalLoans.co.za  [employer] (for lesbian, gay, bisexual, transgender, or questioning youth): www.thetrevorproject.org Contact a health care provider if:  You feel as though you are a burden to others.  You feel agitated, angry, vengeful, or have extreme mood swings.  You have withdrawn from family and friends. Get help right away if:  You are talking about suicide or wishing to die.  You start making plans for how to commit suicide.  You feel that you have no reason to live.  You start making plans for putting your affairs in order, saying goodbye, or giving your possessions away.  You feel guilt, shame, or unbearable  pain, and it seems like there is no way out.  You are frequently using drugs or alcohol.  You are engaging in risky behaviors that could lead to death. If you have any of these symptoms, get help right away. Call emergency services, go to your nearest emergency department or crisis center, or call a suicide crisis helpline. Summary  Suicide is when you take your own life.  Promise yourself that you will not do anything extreme when you have suicidal feelings.  Let family, friends, teachers, or counselors know how you are feeling.  Get help right away if you feel as though life is getting too tough to handle and you are thinking about suicide. This information is not intended to replace advice given to you by your health care provider. Make sure you discuss any questions you have with your health care provider. Document Revised: 07/14/2018 Document Reviewed: 11/03/2016 Elsevier Patient Education  [place].

## 2020-01-23 NOTE — Progress Notes (Signed)
Chief Complaint  Patient presents with  . followup from mychart message    Subjective William Shea presents for f/u anxiety/depression.  Pt is currently being treated with Lexapro 5 mg/d.  Reports he was doing OK.  Pt's wife of 46 yrs recently left him. He is not handling it well. 7.5 weeks.  No thoughts of harming others. Sometimes in the evenings he has thoughts of harming self. He has access to deadly weapons at home. No attempts and no plan.  He has people he can call if he feels suicidal. No self-medication with prescription drugs or illicit drugs. He does drink alcohol nightly. He has increased a little since this happened. He does not drink during the day or have issues working because of the amount of drinking he does.  Pt is following with a counselor/psychologist, but was referred to one yesterday.  Past Medical History:  Diagnosis Date  . Allergy    seasonal  . Anemia   . Anxiety and depression 02/07/2014  . Arthritis of knee 10/17/2016  . Cervical disc disorder with radiculopathy of cervical region 12/27/2015  . Colon cancer screening 02/07/2014  . High cholesterol   . Hyperlipidemia 12/27/2015  . Hypertension   . Intermittent explosive disorder 05/01/2014  . Prostate cancer screening 02/19/2017  . Rash and nonspecific skin eruption 02/19/2017  . Visit for preventive health examination 07/19/2013  . Weight gain 06/17/2016   Allergies as of 01/23/2020   No Known Allergies     Medication List       Accurate as of January 23, 2020  8:28 AM. If you have any questions, ask your nurse or doctor.        aspirin EC 81 MG tablet Take 1 tablet (81 mg total) by mouth daily.   Centrum Silver Adult 50+ Tabs Take 1 tablet by mouth daily.   escitalopram 10 MG tablet Commonly known as: Lexapro Take 1 tablet (10 mg total) by mouth daily. What changed:   medication strength  how much to take Changed by: Shelda Pal, DO   fluticasone 50 MCG/ACT nasal  spray Commonly known as: FLONASE Place 2 sprays into both nostrils daily.   ibuprofen 200 MG tablet Commonly known as: ADVIL Take 200 mg by mouth every 6 (six) hours as needed for moderate pain.   lisinopril-hydrochlorothiazide 20-12.5 MG tablet Commonly known as: ZESTORETIC Take 1 tablet by mouth 2 (two) times daily.   nitroGLYCERIN 0.4 MG SL tablet Commonly known as: NITROSTAT Place 1 tablet (0.4 mg total) under the tongue every 5 (five) minutes as needed.   rosuvastatin 40 MG tablet Commonly known as: CRESTOR TAKE ONE TABLET BY MOUTH ONE TIME DAILY       Exam BP 108/68 (BP Location: Left Arm, Patient Position: Sitting, Cuff Size: Normal)   Pulse 70   Temp 98.1 F (36.7 C) (Oral)   Ht 5\' 11"  (1.803 m)   Wt 200 lb 2 oz (90.8 kg)   SpO2 98%   BMI 27.91 kg/m  General:  well developed, well nourished, in no apparent distress Lungs:  No respiratory distress Psych: well oriented with normal range of affect and age-appropriate judgement/insight, alert and oriented x4. He did become tearful during the exam.   Assessment and Plan  Situational mixed anxiety and depressive disorder  Status: New/uncontrolled. Increase dosage of Lexapro from 5 mg/d to 10 mg/d. Counseling info given. [facility] and health dept UC info given in case he does get suicidal. Suicide hotlines/info provided. Rec he  cut back to baseline alcohol use. Rec he move deadly weapons to a friend's house. Counseled on exercise.  F/u in 1 mo. The patient voiced understanding and agreement to the plan.  [place], DO 01/23/20 8:28 AM

## 2020-01-25 ENCOUNTER — Other Ambulatory Visit: Payer: Self-pay | Admitting: Family Medicine

## 2020-01-25 DIAGNOSIS — E7800 Pure hypercholesterolemia, unspecified: Secondary | ICD-10-CM | POA: Insufficient documentation

## 2020-01-25 DIAGNOSIS — D649 Anemia, unspecified: Secondary | ICD-10-CM | POA: Insufficient documentation

## 2020-01-25 DIAGNOSIS — T7840XA Allergy, unspecified, initial encounter: Secondary | ICD-10-CM | POA: Insufficient documentation

## 2020-01-30 ENCOUNTER — Ambulatory Visit (INDEPENDENT_AMBULATORY_CARE_PROVIDER_SITE_OTHER): Payer: No Typology Code available for payment source | Admitting: Cardiology

## 2020-01-30 ENCOUNTER — Encounter: Payer: Self-pay | Admitting: Cardiology

## 2020-01-30 ENCOUNTER — Other Ambulatory Visit: Payer: Self-pay

## 2020-01-30 ENCOUNTER — Ambulatory Visit: Payer: No Typology Code available for payment source | Admitting: Family Medicine

## 2020-01-30 ENCOUNTER — Encounter: Payer: Self-pay | Admitting: Family Medicine

## 2020-01-30 VITALS — BP 110/72 | HR 79 | Temp 98.1°F | Ht 71.0 in | Wt 208.0 lb

## 2020-01-30 VITALS — BP 136/70 | HR 72 | Ht 71.0 in | Wt 203.0 lb

## 2020-01-30 DIAGNOSIS — I10 Essential (primary) hypertension: Secondary | ICD-10-CM | POA: Diagnosis not present

## 2020-01-30 DIAGNOSIS — E782 Mixed hyperlipidemia: Secondary | ICD-10-CM | POA: Diagnosis not present

## 2020-01-30 DIAGNOSIS — D500 Iron deficiency anemia secondary to blood loss (chronic): Secondary | ICD-10-CM

## 2020-01-30 DIAGNOSIS — I209 Angina pectoris, unspecified: Secondary | ICD-10-CM

## 2020-01-30 DIAGNOSIS — L299 Pruritus, unspecified: Secondary | ICD-10-CM | POA: Diagnosis not present

## 2020-01-30 MED ORDER — FLUTICASONE PROPIONATE 50 MCG/ACT NA SUSP: 2.0000 | Freq: Every day | NASAL | 6 refills | Status: DC

## 2020-01-30 MED ORDER — TRIAMCINOLONE ACETONIDE 0.1 % EX CREA: 1.0000 "application " | TOPICAL_CREAM | Freq: Two times a day (BID) | CUTANEOUS | 0 refills | Status: DC

## 2020-01-30 NOTE — Progress Notes (Signed)
Chief Complaint  Patient presents with  . Rash    William Shea is a 54 y.o. male here for a skin complaint.  Duration: 1 week Location: armpits and around waistline Pruritic? Yes Painful? No Drainage? No New soaps/lotions/topicals/detergents? Changed deodorant brands; otherwise no Other associated symptoms: No fevers Therapies tried thus far: Left over cream from before, forgot name  Past Medical History:  Diagnosis Date  . Allergy    seasonal  . Anemia   . Angina pectoris ([place]) 08/18/2019  . Anxiety and depression 02/07/2014  . Arthritis of knee 10/17/2016  . Cervical disc disorder with radiculopathy of cervical region 12/27/2015  . Colon cancer screening 02/07/2014  . High cholesterol   . Hyperlipidemia 12/27/2015  . Hypertension   . Intermittent explosive disorder 05/01/2014  . Iron deficiency anemia due to chronic blood loss 11/07/2019  . Prostate cancer screening 02/19/2017  . Rash and nonspecific skin eruption 02/19/2017  . Visit for preventive health examination 07/19/2013  . Weight gain 06/17/2016    BP 110/72 (BP Location: Left Arm, Patient Position: Sitting, Cuff Size: Normal)   Pulse 79   Temp 98.1 F (36.7 C) (Oral)   Ht 5\' 11"  (1.803 m)   Wt 208 lb (94.3 kg)   SpO2 98%   BMI 29.01 kg/m  Gen: awake, alert, appearing stated age Lungs: No accessory muscle use Skin: erythematous paples and patches in b/l axilla and around waist. No drainage, erythema, TTP, fluctuance, excoriation Psych: Age appropriate judgment and insight  Pruritic dermatitis - Plan: triamcinolone cream (KENALOG) 0.1 %  Kenalog bid. Avoid scented products. Try not to itch. Ice/cold to help w itching.  F/u prn. The patient voiced understanding and agreement to the plan.  [place], DO 01/30/20 2:56 PM

## 2020-01-30 NOTE — Patient Instructions (Signed)
Avoid scented products if able.  Try not to itch.  Ice/cold pack over area for 10-15 min twice daily.  Let us know if you need anything.

## 2020-01-30 NOTE — Progress Notes (Signed)
Cardiology Office Note:    Date:  01/30/2020   ID:  William Shea, DOB 22-Jun-1965, MRN [id number]  PCP:  Shelda Pal, DO  Cardiologist:  Jenne Campus, MD    Referring MD: Shelda Pal*   No chief complaint on file. M doing dramatically better  History of Present Illness:    William Shea is a 54 y.o. male who was referred to [place] initially because of chest pain.  He did have fairly typical exertional tightness in the chest with shortness of breath.  Initial impression was that he had coronary artery disease and he was actually scheduled to have a cardiac catheterization, however, routine work-up before cardiac catheterization revealed significant anemia which is iron deficient.  He was promptly managed with iron supplementation and he said he is feels dramatically better all the symptoms that he complained about when he came initially out disappeared.  Denies having any chest pain tightness squeezing pressure burning chest no shortness of breath.  He said he is doing great.  Past Medical History:  Diagnosis Date  . Allergy    seasonal  . Anemia   . Angina pectoris ([place]) 08/18/2019  . Anxiety and depression 02/07/2014  . Arthritis of knee 10/17/2016  . Cervical disc disorder with radiculopathy of cervical region 12/27/2015  . Colon cancer screening 02/07/2014  . High cholesterol   . Hyperlipidemia 12/27/2015  . Hypertension   . Intermittent explosive disorder 05/01/2014  . Iron deficiency anemia due to chronic blood loss 11/07/2019  . Prostate cancer screening 02/19/2017  . Rash and nonspecific skin eruption 02/19/2017  . Visit for preventive health examination 07/19/2013  . Weight gain 06/17/2016    Past Surgical History:  Procedure Laterality Date  . KNEE ARTHROSCOPY W/ MENISCAL REPAIR      Current Medications: Current Meds  Medication Sig  . aspirin EC 81 MG tablet Take 1 tablet (81 mg total) by mouth daily.  Marland Kitchen escitalopram (LEXAPRO) 10 MG tablet Take  1 tablet (10 mg total) by mouth daily.  . fluticasone (FLONASE) 50 MCG/ACT nasal spray Place 2 sprays into both nostrils daily.  Marland Kitchen ibuprofen (ADVIL,MOTRIN) 200 MG tablet Take 200 mg by mouth every 6 (six) hours as needed for moderate pain.   Marland Kitchen lisinopril-hydrochlorothiazide (ZESTORETIC) 20-12.5 MG tablet Take 1 tablet by mouth 2 (two) times daily.   . Multiple Vitamins-Minerals (CENTRUM SILVER ADULT 50+) TABS Take 1 tablet by mouth daily.  . nitroGLYCERIN (NITROSTAT) 0.4 MG SL tablet Place 1 tablet (0.4 mg total) under the tongue every 5 (five) minutes as needed.  . rosuvastatin (CRESTOR) 40 MG tablet TAKE ONE TABLET BY MOUTH ONE TIME DAILY     Allergies:   Patient has no known allergies.   Social History   Socioeconomic History  . Marital status: Married    Spouse name: Not on file  . Number of children: Not on file  . Years of education: Not on file  . Highest education level: Not on file  Occupational History  . Not on file  Tobacco Use  . Smoking status: Never Smoker  . Smokeless tobacco: Never Used  Vaping Use  . Vaping Use: Never used  Substance and Sexual Activity  . Alcohol use: Yes    Alcohol/week: 20.0 standard drinks    Types: 20 Standard drinks or equivalent per week  . Drug use: Yes    Types: Marijuana    Comment: last use yesterday 29 October 2019  . Sexual activity: Not on file  Other Topics Concern  . Not on file  Social History Narrative  . Not on file   Social Determinants of Health   Financial Resource Strain:   . Difficulty of Paying Living Expenses: Not on file  Food Insecurity:   . Worried About Charity fundraiser in the Last Year: Not on file  . Ran Out of Food in the Last Year: Not on file  Transportation Needs:   . Lack of Transportation (Medical): Not on file  . Lack of Transportation (Non-Medical): Not on file  Physical Activity:   . Days of Exercise per Week: Not on file  . Minutes of Exercise per Session: Not on file  Stress:   . Feeling  of Stress : Not on file  Social Connections:   . Frequency of Communication with Friends and Family: Not on file  . Frequency of Social Gatherings with Friends and Family: Not on file  . Attends Religious Services: Not on file  . Active Member of Clubs or Organizations: Not on file  . Attends Archivist Meetings: Not on file  . Marital Status: Not on file     Family History: The patient's family history includes Alzheimer's disease in his maternal grandmother; Diabetes in his sister and another family member; Diabetes (age of onset: 3) in his father; Healthy in his son; Heart attack (age of onset: 58) in his father; Hypertension in his mother; Obesity in his father; Thyroid disease in his mother. There is no history of Colon cancer, Rectal cancer, Stomach cancer, Colon polyps, or Esophageal cancer. ROS:   Please see the history of present illness.    All 14 point review of systems negative except as described per history of present illness  EKGs/Labs/Other Studies Reviewed:      Recent Labs: 09/20/2019: ALT 38; BUN 20; Creatinine 0.90; Potassium 4.2; Sodium 131 12/15/2019: Hemoglobin 12.6; Platelet Count 289  Recent Lipid Panel    Component Value Date/Time   CHOL 230 (H) 04/13/2019 0727   TRIG 227.0 (H) 04/13/2019 0727   HDL 53.90 04/13/2019 0727   CHOLHDL 4 04/13/2019 0727   VLDL 45.4 (H) 04/13/2019 0727   LDLCALC 110 (H) 06/10/2018 0804   LDLDIRECT 146.0 04/13/2019 0727    Physical Exam:    VS:  BP 136/70   Pulse 72   Ht 5\' 11"  (1.803 m)   Wt 203 lb (92.1 kg)   SpO2 99%   BMI 28.31 kg/m     Wt Readings from Last 3 Encounters:  01/30/20 203 lb (92.1 kg)  01/23/20 200 lb 2 oz (90.8 kg)  12/15/19 205 lb 1.3 oz (93 kg)     GEN:  Well nourished, well developed in no acute distress HEENT: Normal NECK: No JVD; No carotid bruits LYMPHATICS: No lymphadenopathy CARDIAC: RRR, no murmurs, no rubs, no gallops RESPIRATORY:  Clear to auscultation without rales,  wheezing or rhonchi  ABDOMEN: Soft, non-tender, non-distended MUSCULOSKELETAL:  No edema; No deformity  SKIN: Warm and dry LOWER EXTREMITIES: no swelling NEUROLOGIC:  Alert and oriented x 3 PSYCHIATRIC:  Normal affect   ASSESSMENT:    1. Angina pectoris ([place])   2. Primary hypertension   3. Mixed hyperlipidemia   4. Iron deficiency anemia due to chronic blood loss    PLAN:    In order of problems listed above:  1. Angina pectoris: Right now he does not have any symptoms.  We spent a great deal of time talking about options and the situation.  He  is feeling great and he does not want to pursue cardiac catheterization and I agree with him that at this situation there is no indications.  However, I think it would be reasonable to look at his coronaries make sure there is no significant coronary artery disease.  We did talk about potentially doing stress test versus coronary CT angio.  I prefer coronary CT angio since I suspect he may have lesion less than 70% not significant when he is not anemic but overall it is important to know if he does have coronary artery disease.  He does not want to do it right now he said he prefer to watch and see how the situation evolves.  I did tell him if he still having some chest pain tightness squeezing pressure burning chest shortness of breath need to let me know immediately and then will pursue aggressive CAD work-up. 2. Essential hypertension blood pressure well controlled continue present management. 3. Mixed dyslipidemia last time I increase his Crestor, I will check his fasting lipid profile today. 4. Iron deficiency anemia: Managed by internal medicine team as well as oncology hematology team.  I did review their note for this visit.  Overall quite interesting situation gentleman who came to me with fairly typical angina pectoris however was found to be anemic anemia treated appropriately he is feeling dramatically better now asymptomatic.  Plan is to  continue watching him.  This is as per his wishes.  If he develop any symptoms will aggressively pursue CAD in the meantime we concentrate on l risk factors modifications   Medication Adjustments/Labs and Tests Ordered: Current medicines are reviewed at length with the patient today.  Concerns regarding medicines are outlined above.  No orders of the defined types were placed in this encounter.  Medication changes: No orders of the defined types were placed in this encounter.   Signed, Park Liter, MD, [facility] 01/30/2020 8:32 AM    [place]

## 2020-01-30 NOTE — Patient Instructions (Signed)
Medication Instructions:  Your physician recommends that you continue on your current medications as directed. Please refer to the Current Medication list given to you today.  *If you need a refill on your cardiac medications before your next appointment, please call your pharmacy*   Lab Work: Your physician recommends that you return for lab work today: lipid  If you have labs (blood work) drawn today and your tests are completely normal, you will receive your results only by: . MyChart Message (if you have MyChart) OR . A paper copy in the mail If you have any lab test that is abnormal or we need to change your treatment, we will call you to review the results.   Testing/Procedures: None   Follow-Up: At [facility], you and your health needs are our priority.  As part of our continuing mission to provide you with exceptional heart care, we have created designated Provider Care Teams.  These Care Teams include your primary Cardiologist (physician) and Advanced Practice Providers (APPs -  Physician Assistants and Nurse Practitioners) who all work together to provide you with the care you need, when you need it.  We recommend signing up for the patient portal called "MyChart".  Sign up information is provided on this After Visit Summary.  MyChart is used to connect with patients for Virtual Visits (Telemedicine).  Patients are able to view lab/test results, encounter notes, upcoming appointments, etc.  Non-urgent messages can be sent to your provider as well.   To learn more about what you can do with MyChart, go to [email or web address].    Your next appointment:   3 month(s)  The format for your next appointment:   In Person  Provider:   Robert Krasowski, MD   Other Instructions   

## 2020-01-31 LAB — LIPID PANEL
Chol/HDL Ratio: 3.6 ratio (ref 0.0–5.0)
Cholesterol, Total: 227 mg/dL — ABNORMAL HIGH (ref 100–199)
HDL: 63 mg/dL (ref >39)
LDL Chol Calc (NIH): 113 mg/dL — ABNORMAL HIGH (ref 0–99)
Triglycerides: 299 mg/dL — ABNORMAL HIGH (ref 0–149)
VLDL Cholesterol Cal: 51 mg/dL — ABNORMAL HIGH (ref 5–40)

## 2020-02-06 ENCOUNTER — Ambulatory Visit (INDEPENDENT_AMBULATORY_CARE_PROVIDER_SITE_OTHER): Payer: No Typology Code available for payment source | Admitting: Psychologist

## 2020-02-06 DIAGNOSIS — F320 Major depressive disorder, single episode, mild: Secondary | ICD-10-CM | POA: Diagnosis not present

## 2020-02-06 DIAGNOSIS — Z638 Other specified problems related to primary support group: Secondary | ICD-10-CM | POA: Diagnosis not present

## 2020-02-06 DIAGNOSIS — Z630 Problems in relationship with spouse or partner: Secondary | ICD-10-CM

## 2020-02-16 DIAGNOSIS — L299 Pruritus, unspecified: Secondary | ICD-10-CM

## 2020-02-16 MED ORDER — TRIAMCINOLONE ACETONIDE 0.1 % EX CREA: 1.0000 "application " | TOPICAL_CREAM | Freq: Two times a day (BID) | CUTANEOUS | 0 refills | Status: DC

## 2020-02-21 ENCOUNTER — Ambulatory Visit: Payer: No Typology Code available for payment source | Admitting: Psychologist

## 2020-03-15 ENCOUNTER — Encounter: Payer: Self-pay | Admitting: Family

## 2020-03-15 ENCOUNTER — Inpatient Hospital Stay: Payer: No Typology Code available for payment source | Attending: Hematology & Oncology | Admitting: Family

## 2020-03-15 ENCOUNTER — Telehealth: Payer: Self-pay | Admitting: Family

## 2020-03-15 ENCOUNTER — Other Ambulatory Visit: Payer: Self-pay

## 2020-03-15 ENCOUNTER — Inpatient Hospital Stay: Payer: No Typology Code available for payment source

## 2020-03-15 VITALS — BP 124/67 | HR 62 | Temp 97.8°F | Resp 18 | Ht 71.0 in | Wt 209.0 lb

## 2020-03-15 DIAGNOSIS — D509 Iron deficiency anemia, unspecified: Secondary | ICD-10-CM | POA: Insufficient documentation

## 2020-03-15 DIAGNOSIS — D500 Iron deficiency anemia secondary to blood loss (chronic): Secondary | ICD-10-CM | POA: Diagnosis not present

## 2020-03-15 DIAGNOSIS — Z79899 Other long term (current) drug therapy: Secondary | ICD-10-CM | POA: Insufficient documentation

## 2020-03-15 LAB — CBC WITH DIFFERENTIAL (CANCER CENTER ONLY)
Abs Immature Granulocytes: 0.09 10*3/uL — ABNORMAL HIGH (ref 0.00–0.07)
Basophils Absolute: 0.1 10*3/uL (ref 0.0–0.1)
Basophils Relative: 1 %
Eosinophils Absolute: 0.3 10*3/uL (ref 0.0–0.5)
Eosinophils Relative: 4 %
HCT: 40.1 % (ref 39.0–52.0)
Hemoglobin: 13.4 g/dL (ref 13.0–17.0)
Immature Granulocytes: 1 %
Lymphocytes Relative: 23 %
Lymphs Abs: 1.7 10*3/uL (ref 0.7–4.0)
MCH: 31.5 pg (ref 26.0–34.0)
MCHC: 33.4 g/dL (ref 30.0–36.0)
MCV: 94.1 fL (ref 80.0–100.0)
Monocytes Absolute: 0.6 10*3/uL (ref 0.1–1.0)
Monocytes Relative: 8 %
Neutro Abs: 4.7 10*3/uL (ref 1.7–7.7)
Neutrophils Relative %: 63 %
Platelet Count: 319 10*3/uL (ref 150–400)
RBC: 4.26 MIL/uL (ref 4.22–5.81)
RDW: 13.3 % (ref 11.5–15.5)
WBC Count: 7.4 10*3/uL (ref 4.0–10.5)
nRBC: 0 % (ref 0.0–0.2)

## 2020-03-15 LAB — IRON AND TIBC
Iron: 86 ug/dL (ref 42–163)
Saturation Ratios: 19 % — ABNORMAL LOW (ref 20–55)
TIBC: 446 ug/dL — ABNORMAL HIGH (ref 202–409)
UIBC: 361 ug/dL (ref 117–376)

## 2020-03-15 LAB — RETICULOCYTES
Immature Retic Fract: 17 % — ABNORMAL HIGH (ref 2.3–15.9)
RBC.: 4.31 MIL/uL (ref 4.22–5.81)
Retic Count, Absolute: 96.1 10*3/uL (ref 19.0–186.0)
Retic Ct Pct: 2.2 % (ref 0.4–3.1)

## 2020-03-15 LAB — FERRITIN: Ferritin: 25 ng/mL (ref 24–336)

## 2020-03-15 NOTE — Progress Notes (Signed)
Hematology and Oncology Follow Up Visit  William Shea [id number] July 26, 1965 54 y.o. 03/15/2020   Principle Diagnosis:  Iron deficiency anemia  Current Therapy: IV iron as indicated   Interim History:  William Shea is here today for follow-up. He is doing quite well and has no complaints at this time.  He notes that his symptoms have resolved.  He has occasional bright red blood with BM but no other blood loss noted. No abnormal bruising, no petechiae.  No fever, chills, n/v, cough, rash, dizziness, SOB, chest pain, palpitations, abdominal pain or changes in bowel or bladder habits.  No swelling, tenderness, numbness or tingling in his extremities.  No falls or syncope.  He has maintained a good appetite and is staying well hydrated. Her weight is stable.   ECOG Performance Status: 0 - Asymptomatic  Medications:  Allergies as of 03/15/2020   No Known Allergies     Medication List       Accurate as of March 15, 2020 10:47 AM. If you have any questions, ask your nurse or doctor.        aspirin EC 81 MG tablet Take 1 tablet (81 mg total) by mouth daily.   Centrum Silver Adult 50+ Tabs Take 1 tablet by mouth daily.   escitalopram 10 MG tablet Commonly known as: Lexapro Take 1 tablet (10 mg total) by mouth daily.   fluticasone 50 MCG/ACT nasal spray Commonly known as: FLONASE Place 2 sprays into both nostrils daily.   ibuprofen 200 MG tablet Commonly known as: ADVIL Take 200 mg by mouth every 6 (six) hours as needed for moderate pain.   lisinopril-hydrochlorothiazide 20-12.5 MG tablet Commonly known as: ZESTORETIC Take 1 tablet by mouth 2 (two) times daily.   nitroGLYCERIN 0.4 MG SL tablet Commonly known as: NITROSTAT Place 1 tablet (0.4 mg total) under the tongue every 5 (five) minutes as needed.   rosuvastatin 40 MG tablet Commonly known as: CRESTOR TAKE ONE TABLET BY MOUTH ONE TIME DAILY   triamcinolone 0.1 % Commonly known as:  KENALOG Apply 1 application topically 2 (two) times daily.       Allergies: No Known Allergies  Past Medical History, Surgical history, Social history, and Family History were reviewed and updated.  Review of Systems: All other 10 point review of systems is negative.   Physical Exam:  vitals were not taken for this visit.   Wt Readings from Last 3 Encounters:  01/30/20 208 lb (94.3 kg)  01/30/20 203 lb (92.1 kg)  01/23/20 200 lb 2 oz (90.8 kg)    Ocular: Sclerae unicteric, pupils equal, round and reactive to light Ear-nose-throat: Oropharynx clear, dentition fair Lymphatic: No cervical or supraclavicular adenopathy Lungs no rales or rhonchi, good excursion bilaterally Heart regular rate and rhythm, no murmur appreciated Abd soft, nontender, positive bowel sounds MSK no focal spinal tenderness, no joint edema Neuro: non-focal, well-oriented, appropriate affect Breasts: Deferred   Lab Results  Component Value Date   WBC 7.4 03/15/2020   HGB 13.4 03/15/2020   HCT 40.1 03/15/2020   MCV 94.1 03/15/2020   PLT 319 03/15/2020   Lab Results  Component Value Date   FERRITIN 272 12/15/2019   IRON 134 12/15/2019   TIBC 341 12/15/2019   UIBC 207 12/15/2019   IRONPCTSAT 39 12/15/2019   Lab Results  Component Value Date   RETICCTPCT 2.2 03/15/2020   RBC 4.31 03/15/2020   RBC 4.26 03/15/2020   No results found for: KPAFRELGTCHN, LAMBDASER, KAPLAMBRATIO No results found  for: IGGSERUM, IGA, IGMSERUM No results found for: Ronnald Ramp, A1GS, A2GS, Violet Baldy, MSPIKE, SPEI   Chemistry      Component Value Date/Time   NA 131 (L) 09/20/2019 1519   NA 137 08/21/2019 1536   K 4.2 09/20/2019 1519   CL 97 (L) 09/20/2019 1519   CO2 24 09/20/2019 1519   BUN 20 09/20/2019 1519   BUN 17 08/21/2019 1536   CREATININE 0.90 09/20/2019 1519   CREATININE 0.97 08/30/2013 0910      Component Value Date/Time   CALCIUM 9.0 09/20/2019 1519   ALKPHOS 76 09/20/2019  1519   AST 47 (H) 09/20/2019 1519   ALT 38 09/20/2019 1519   BILITOT 0.5 09/20/2019 1519       Impression and Plan: William Shea is a very pleasant 54yo caucasian gentleman withiron deficiency anemia felt to be due to intermittent GI blood loss. He is asymptomatic at this time and doing well.  Hgb is up to 13.4, MCV 94 and platelets 319.  We will see what his iron studies look like and replace if needed.  Follow-up in 6 months.  He was encouraged to contact our office with any questions or concerns.   Laverna Peace, NP 12/10/202110:47 AM

## 2020-03-15 NOTE — Telephone Encounter (Signed)
Appointments scheduled patient has My Chart Access per 12/10 los

## 2020-03-18 ENCOUNTER — Telehealth: Payer: Self-pay | Admitting: Family

## 2020-03-18 NOTE — Telephone Encounter (Signed)
I called and spoke with patient regarding appointments for iron infusion.  H e will let us know about scheduling the other 2 iron infusions at his appointment on 12/17.  He may want to hold off until after New Years for the next two infusions per 12/13 sch msg

## 2020-03-22 ENCOUNTER — Telehealth: Payer: Self-pay

## 2020-03-22 ENCOUNTER — Other Ambulatory Visit: Payer: Self-pay

## 2020-03-22 ENCOUNTER — Inpatient Hospital Stay: Payer: No Typology Code available for payment source

## 2020-03-22 VITALS — BP 119/72 | HR 64 | Temp 98.2°F | Resp 17

## 2020-03-22 DIAGNOSIS — D500 Iron deficiency anemia secondary to blood loss (chronic): Secondary | ICD-10-CM

## 2020-03-22 DIAGNOSIS — D509 Iron deficiency anemia, unspecified: Secondary | ICD-10-CM | POA: Diagnosis not present

## 2020-03-22 MED ORDER — SODIUM CHLORIDE 0.9 % IV SOLN
Freq: Once | INTRAVENOUS | Status: AC
Start: 2020-03-22 — End: 2020-03-22
  Filled 2020-03-22: qty 250

## 2020-03-22 MED ORDER — SODIUM CHLORIDE 0.9 % IV SOLN
200.0000 mg | Freq: Once | INTRAVENOUS | Status: AC
  Administered 2020-03-22: 200 mg via INTRAVENOUS
  Filled 2020-03-22: qty 200

## 2020-03-22 NOTE — Progress Notes (Signed)
Pt declined to stay for post infusion observation period. Pt stated he has tolerated medication multiple times prior without difficulty. Pt aware to call clinic with any questions or concerns. Pt verbalized understanding and had no further questions.   

## 2020-03-22 NOTE — Telephone Encounter (Signed)
2 additional doses of iv iron scheduled and pt will view on mychart,,,, AOM

## 2020-03-22 NOTE — Patient Instructions (Signed)

## 2020-03-25 ENCOUNTER — Other Ambulatory Visit: Payer: Self-pay

## 2020-03-25 ENCOUNTER — Inpatient Hospital Stay: Payer: No Typology Code available for payment source

## 2020-03-25 VITALS — BP 120/76 | HR 62 | Temp 98.3°F | Resp 17

## 2020-03-25 DIAGNOSIS — D509 Iron deficiency anemia, unspecified: Secondary | ICD-10-CM | POA: Diagnosis not present

## 2020-03-25 DIAGNOSIS — D500 Iron deficiency anemia secondary to blood loss (chronic): Secondary | ICD-10-CM

## 2020-03-25 MED ORDER — SODIUM CHLORIDE 0.9 % IV SOLN
200.0000 mg | Freq: Once | INTRAVENOUS | Status: AC
  Administered 2020-03-25: 200 mg via INTRAVENOUS
  Filled 2020-03-25: qty 200

## 2020-03-25 MED ORDER — SODIUM CHLORIDE 0.9 % IV SOLN
Freq: Once | INTRAVENOUS | Status: AC
  Filled 2020-03-25: qty 250

## 2020-03-25 NOTE — Patient Instructions (Addendum)

## 2020-04-01 ENCOUNTER — Inpatient Hospital Stay: Payer: No Typology Code available for payment source

## 2020-04-10 ENCOUNTER — Other Ambulatory Visit: Payer: Self-pay

## 2020-04-10 ENCOUNTER — Inpatient Hospital Stay: Payer: No Typology Code available for payment source | Attending: Hematology & Oncology

## 2020-04-10 VITALS — BP 115/63 | HR 66 | Temp 98.3°F | Resp 17

## 2020-04-10 DIAGNOSIS — D509 Iron deficiency anemia, unspecified: Secondary | ICD-10-CM | POA: Insufficient documentation

## 2020-04-10 DIAGNOSIS — D500 Iron deficiency anemia secondary to blood loss (chronic): Secondary | ICD-10-CM

## 2020-04-10 MED ORDER — SODIUM CHLORIDE 0.9 % IV SOLN
200.0000 mg | Freq: Once | INTRAVENOUS | Status: AC
  Administered 2020-04-10: 200 mg via INTRAVENOUS
  Filled 2020-04-10: qty 200

## 2020-04-10 MED ORDER — SODIUM CHLORIDE 0.9 % IV SOLN
Freq: Once | INTRAVENOUS | Status: AC
  Filled 2020-04-10: qty 250

## 2020-04-10 NOTE — Patient Instructions (Signed)

## 2020-04-13 ENCOUNTER — Other Ambulatory Visit: Payer: Self-pay | Admitting: Family Medicine

## 2020-04-30 ENCOUNTER — Other Ambulatory Visit: Payer: Self-pay | Admitting: Family Medicine

## 2020-04-30 DIAGNOSIS — F4323 Adjustment disorder with mixed anxiety and depressed mood: Secondary | ICD-10-CM

## 2020-05-07 ENCOUNTER — Other Ambulatory Visit: Payer: Self-pay

## 2020-05-07 ENCOUNTER — Encounter: Payer: Self-pay | Admitting: Cardiology

## 2020-05-07 ENCOUNTER — Ambulatory Visit (INDEPENDENT_AMBULATORY_CARE_PROVIDER_SITE_OTHER): Payer: No Typology Code available for payment source | Admitting: Cardiology

## 2020-05-07 VITALS — BP 122/64 | HR 66 | Ht 72.0 in | Wt 215.0 lb

## 2020-05-07 DIAGNOSIS — I209 Angina pectoris, unspecified: Secondary | ICD-10-CM

## 2020-05-07 DIAGNOSIS — I10 Essential (primary) hypertension: Secondary | ICD-10-CM

## 2020-05-07 DIAGNOSIS — E7800 Pure hypercholesterolemia, unspecified: Secondary | ICD-10-CM | POA: Diagnosis not present

## 2020-05-07 MED ORDER — METOPROLOL TARTRATE 100 MG PO TABS: 100.0000 mg | ORAL_TABLET | Freq: Once | ORAL | 0 refills | Status: DC

## 2020-05-07 NOTE — Patient Instructions (Signed)
Medication Instructions:  Your physician recommends that you continue on your current medications as directed. Please refer to the Current Medication list given to you today.  *If you need a refill on your cardiac medications before your next appointment, please call your pharmacy*   Lab Work: Your physician recommends that you return for lab work today: bmp, lipid  If you have labs (blood work) drawn today and your tests are completely normal, you will receive your results only by: Marland Kitchen MyChart Message (if you have MyChart) OR . A paper copy in the mail If you have any lab test that is abnormal or we need to change your treatment, we will call you to review the results.   Testing/Procedures: Your cardiac CT will be scheduled at one of the below locations:   [facility] [address] [place], [place] [address] [phone]  [place] [address] [place], [place] [address] [phone]  If scheduled at [facility], please arrive at the [facility] main entrance of [facility] 30 minutes prior to test start time. Proceed to the [facility] Radiology Department (first floor) to check-in and test prep.  If scheduled at [facility], please arrive 15 mins early for check-in and test prep.  Please follow these instructions carefully (unless otherwise directed):  Hold all erectile dysfunction medications at least 3 days (72 hrs) prior to test.  On the Night Before the Test: . Be sure to Drink plenty of water. . Do not consume any caffeinated/decaffeinated beverages or chocolate 12 hours prior to your test. . Do not take any antihistamines 12 hours prior to your test.   On the Day of the Test: . Drink plenty of water. Do not drink any water within one hour of the test. . Do not eat any food 4 hours prior to the test. . You may take your regular medications prior to the  test.  . Take metoprolol (Lopressor) two hours prior to test.         After the Test: . Drink plenty of water. . After receiving IV contrast, you may experience a mild flushed feeling. This is normal. . On occasion, you may experience a mild rash up to 24 hours after the test. This is not dangerous. If this occurs, you can take Benadryl 25 mg and increase your fluid intake. . If you experience trouble breathing, this can be serious. If it is severe call 911 IMMEDIATELY. If it is mild, please call our office. . If you take any of these medications: Glipizide/Metformin, Avandament, Glucavance, please do not take 48 hours after completing test unless otherwise instructed.   Once we have confirmed authorization from your insurance company, we will call you to set up a date and time for your test. Based on how quickly your insurance processes prior authorizations requests, please allow up to 4 weeks to be contacted for scheduling your Cardiac CT appointment. Be advised that routine Cardiac CT appointments could be scheduled as many as 8 weeks after your provider has ordered it.  For non-scheduling related questions, please contact the cardiac imaging nurse navigator should you have any questions/concerns: Marchia Bond, Cardiac Imaging Nurse Navigator Burley Saver, Interim Cardiac Imaging Nurse [place] [facility] Direct Office Dial: [phone]   For scheduling needs, including cancellations and rescheduling, please call [place], [phone].     Follow-Up: At [facility], you and your health needs  are our priority.  As part of our continuing mission to provide you with exceptional heart care, we have created designated Provider Care Teams.  These Care Teams include your primary Cardiologist (physician) and Advanced Practice Providers (APPs -  Physician Assistants and Nurse Practitioners) who all work together to provide you with the care you need, when you need  it.  We recommend signing up for the patient portal called "MyChart".  Sign up information is provided on this After Visit Summary.  MyChart is used to connect with patients for Virtual Visits (Telemedicine).  Patients are able to view lab/test results, encounter notes, upcoming appointments, etc.  Non-urgent messages can be sent to your provider as well.   To learn more about what you can do with MyChart, go to [email or web address].    Your next appointment:   3 month(s)  The format for your next appointment:   In Person  Provider:   Jenne Campus, MD   Other Instructions   Cardiac CT Angiogram A cardiac CT angiogram is a procedure to look at the heart and the area around the heart. It may be done to help find the cause of chest pains or other symptoms of heart disease. During this procedure, a substance called contrast dye is injected into the blood vessels in the area to be checked. A large X-ray machine, called a CT scanner, then takes detailed pictures of the heart and the surrounding area. The procedure is also sometimes called a coronary CT angiogram, coronary artery scanning, or CTA. A cardiac CT angiogram allows the health care provider to see how well blood is flowing to and from the heart. The health care provider will be able to see if there are any problems, such as:  Blockage or narrowing of the coronary arteries in the heart.  Fluid around the heart.  Signs of weakness or disease in the muscles, valves, and tissues of the heart. Tell a health care provider about:  Any allergies you have. This is especially important if you have had a previous allergic reaction to contrast dye.  All medicines you are taking, including vitamins, herbs, eye drops, creams, and over-the-counter medicines.  Any blood disorders you have.  Any surgeries you have had.  Any medical conditions you have.  Whether you are pregnant or may be pregnant.  Any anxiety disorders, chronic  pain, or other conditions you have that may increase your stress or prevent you from lying still. What are the risks? Generally, this is a safe procedure. However, problems may occur, including:  Bleeding.  Infection.  Allergic reactions to medicines or dyes.  Damage to other structures or organs.  Kidney damage from the contrast dye that is used.  Increased risk of cancer from radiation exposure. This risk is low. Talk with your health care provider about: ? The risks and benefits of testing. ? How you can receive the lowest dose of radiation. What happens before the procedure?  Wear comfortable clothing and remove any jewelry, glasses, dentures, and hearing aids.  Follow instructions from your health care provider about eating and drinking. This may include: ? For 12 hours before the procedure -- avoid caffeine. This includes tea, coffee, soda, energy drinks, and diet pills. Drink plenty of water or other fluids that do not have caffeine in them. Being well hydrated can prevent complications. ? For 4-6 hours before the procedure -- stop eating and drinking. The contrast dye can cause nausea, but this is less likely if your stomach is  empty.  Ask your health care provider about changing or stopping your regular medicines. This is especially important if you are taking diabetes medicines, blood thinners, or medicines to treat problems with erections (erectile dysfunction). What happens during the procedure?  Hair on your chest may need to be removed so that small sticky patches called electrodes can be placed on your chest. These will transmit information that helps to monitor your heart during the procedure.  An IV will be inserted into one of your veins.  You might be given a medicine to control your heart rate during the procedure. This will help to ensure that good images are obtained.  You will be asked to lie on an exam table. This table will slide in and out of the CT machine  during the procedure.  Contrast dye will be injected into the IV. You might feel warm, or you may get a metallic taste in your mouth.  You will be given a medicine called nitroglycerin. This will relax or dilate the arteries in your heart.  The table that you are lying on will move into the CT machine tunnel for the scan.  The person running the machine will give you instructions while the scans are being done. You may be asked to: ? Keep your arms above your head. ? Hold your breath. ? Stay very still, even if the table is moving.  When the scanning is complete, you will be moved out of the machine.  The IV will be removed. The procedure may vary among health care providers and hospitals.   What can I expect after the procedure? After your procedure, it is common to have:  A metallic taste in your mouth from the contrast dye.  A feeling of warmth.  A headache from the nitroglycerin. Follow these instructions at home:  Take over-the-counter and prescription medicines only as told by your health care provider.  If you are told, drink enough fluid to keep your urine pale yellow. This will help to flush the contrast dye out of your body.  Most people can return to their normal activities right after the procedure. Ask your health care provider what activities are safe for you.  It is up to you to get the results of your procedure. Ask your health care provider, or the department that is doing the procedure, when your results will be ready.  Keep all follow-up visits as told by your health care provider. This is important. Contact a health care provider if:  You have any symptoms of allergy to the contrast dye. These include: ? Shortness of breath. ? Rash or hives. ? A racing heartbeat. Summary  A cardiac CT angiogram is a procedure to look at the heart and the area around the heart. It may be done to help find the cause of chest pains or other symptoms of heart  disease.  During this procedure, a large X-ray machine, called a CT scanner, takes detailed pictures of the heart and the surrounding area after a contrast dye has been injected into blood vessels in the area.  Ask your health care provider about changing or stopping your regular medicines before the procedure. This is especially important if you are taking diabetes medicines, blood thinners, or medicines to treat erectile dysfunction.  If you are told, drink enough fluid to keep your urine pale yellow. This will help to flush the contrast dye out of your body. This information is not intended to replace advice given to  you by your health care provider. Make sure you discuss any questions you have with your health care provider. Document Revised: 11/16/2018 Document Reviewed: 11/16/2018 Elsevier Patient Education  2021 [place].  Metoprolol Tablets What is this medicine? METOPROLOL (me TOE proe lole) is a beta blocker. It decreases the amount of work your heart has to do and helps your heart beat regularly. It is used to treat high blood pressure and/or prevent chest pain (also called angina). It is also used after a heart attack to prevent a second one. This medicine may be used for other purposes; ask your health care provider or pharmacist if you have questions. COMMON BRAND NAME(S): Lopressor What should I tell my health care provider before I take this medicine? They need to know if you have any of these conditions:  diabetes  heart or vessel disease like slow heart rate, worsening heart failure, heart block, sick sinus syndrome or Raynaud's disease  kidney disease  liver disease  lung or breathing disease, like asthma or emphysema  pheochromocytoma  thyroid disease  an unusual or allergic reaction to metoprolol, other beta-blockers, medicines, foods, dyes, or preservatives  pregnant or trying to get pregnant  breast-feeding How should I use this medicine? Take this  drug by mouth with water. Take it as directed on the prescription label at the same time every day. You can take it with or without food. You should always take it the same way. Keep taking it unless your health care provider tells you to stop. Talk to your health care provider about the use of this drug in children. Special care may be needed. Overdosage: If you think you have taken too much of this medicine contact a poison control center or emergency room at once. NOTE: This medicine is only for you. Do not share this medicine with others. What if I miss a dose? If you miss a dose, take it as soon as you can. If it is almost time for your next dose, take only that dose. Do not take double or extra doses. What may interact with this medicine? This medicine may interact with the following medications:  certain medicines for blood pressure, heart disease, irregular heart beat  certain medicines for depression like monoamine oxidase (MAO) inhibitors, fluoxetine, or paroxetine  clonidine  dobutamine  epinephrine  isoproterenol  reserpine This list may not describe all possible interactions. Give your health care provider a list of all the medicines, herbs, non-prescription drugs, or dietary supplements you use. Also tell them if you smoke, drink alcohol, or use illegal drugs. Some items may interact with your medicine. What should I watch for while using this medicine? Visit your doctor or health care professional for regular check ups. Contact your doctor right away if your symptoms worsen. Check your blood pressure and pulse rate regularly. Ask your health care professional what your blood pressure and pulse rate should be, and when you should contact them. You may get drowsy or dizzy. Do not drive, use machinery, or do anything that needs mental alertness until you know how this medicine affects you. Do not sit or stand up quickly, especially if you are an older patient. This reduces the  risk of dizzy or fainting spells. Contact your doctor if these symptoms continue. Alcohol may interfere with the effect of this medicine. Avoid alcoholic drinks. This medicine may increase blood sugar. Ask your healthcare provider if changes in diet or medicines are needed if you have diabetes. What side effects  may I notice from receiving this medicine? Side effects that you should report to your doctor or health care professional as soon as possible:  allergic reactions like skin rash, itching or hives  cold or numb hands or feet  depression  difficulty breathing  faint  fever with sore throat  irregular heartbeat, chest pain  rapid weight gain  signs and symptoms of high blood sugar such as being more thirsty or hungry or having to urinate more than normal. You may also feel very tired or have blurry vision.  swollen legs or ankles Side effects that usually do not require medical attention (report to your doctor or health care professional if they continue or are bothersome):  anxiety or nervousness  change in sex drive or performance  dry skin  headache  nightmares or trouble sleeping  short term memory loss  stomach upset or diarrhea This list may not describe all possible side effects. Call your doctor for medical advice about side effects. You may report side effects to FDA at 1-800-FDA-1088. Where should I keep my medicine? Keep out of the reach of children and pets. Store at room temperature between 15 and 30 degrees C (59 and 86 degrees F). Protect from moisture. Keep the container tightly closed. Throw away any unused drug after the expiration date. NOTE: This sheet is a summary. It may not cover all possible information. If you have questions about this medicine, talk to your doctor, pharmacist, or health care provider.  2021 Elsevier/Gold Standard (2018-11-03 17:21:17)

## 2020-05-07 NOTE — Progress Notes (Signed)
Cardiology Office Note:    Date:  05/07/2020   ID:  William Shea, DOB 11-18-1965, MRN [id number]  PCP:  Shelda Pal, DO  Cardiologist:  Jenne Campus, MD    Referring MD: Shelda Pal*   Chief Complaint  Patient presents with  . Follow-up  I need knee replacement surgery  History of Present Illness:    William Shea is a 55 y.o. male who presented initially to our office couple months ago with typical angina pectoris.  He did have exertional tightness squeezing pressure burning chest relieved by rest we were getting ready to do cardiac catheterization, as a part of work-up we find out that he was significantly anemic we aborted the idea of doing cardiac catheterization then he was followed by hematology.  He does have iron deficiency anemia he does required multiple iron infusions since that time he is doing dramatically better.  But now the problem is knee that he needs to have a surgery for.  His ability to exercise is limited because of knee pain.  Denies having atypical angina right now and overall feeling better but again his ability to exercise is diminished because of knee problem.  Past Medical History:  Diagnosis Date  . Allergy    seasonal  . Anemia   . Angina pectoris ([place]) 08/18/2019  . Anxiety and depression 02/07/2014  . Arthritis of knee 10/17/2016  . Cervical disc disorder with radiculopathy of cervical region 12/27/2015  . Colon cancer screening 02/07/2014  . High cholesterol   . Hyperlipidemia 12/27/2015  . Hypertension   . Intermittent explosive disorder 05/01/2014  . Iron deficiency anemia due to chronic blood loss 11/07/2019  . Prostate cancer screening 02/19/2017  . Rash and nonspecific skin eruption 02/19/2017  . Visit for preventive health examination 07/19/2013  . Weight gain 06/17/2016    Past Surgical History:  Procedure Laterality Date  . KNEE ARTHROSCOPY W/ MENISCAL REPAIR      Current Medications: Current Meds  Medication  Sig  . aspirin EC 81 MG tablet Take 1 tablet (81 mg total) by mouth daily.  Marland Kitchen escitalopram (LEXAPRO) 10 MG tablet TAKE ONE TABLET BY MOUTH ONE TIME DAILY  . fluticasone (FLONASE) 50 MCG/ACT nasal spray Place 2 sprays into both nostrils daily.  Marland Kitchen ibuprofen (ADVIL,MOTRIN) 200 MG tablet Take 200 mg by mouth every 6 (six) hours as needed for moderate pain.   Marland Kitchen lisinopril-hydrochlorothiazide (ZESTORETIC) 20-12.5 MG tablet TAKE ONE TABLET BY MOUTH TWICE A DAY  . Multiple Vitamins-Minerals (CENTRUM SILVER ADULT 50+) TABS Take 1 tablet by mouth daily.  . rosuvastatin (CRESTOR) 40 MG tablet TAKE ONE TABLET BY MOUTH ONE TIME DAILY  . triamcinolone cream (KENALOG) 0.1 % Apply 1 application topically 2 (two) times daily.     Allergies:   Patient has no known allergies.   Social History   Socioeconomic History  . Marital status: Married    Spouse name: Not on file  . Number of children: Not on file  . Years of education: Not on file  . Highest education level: Not on file  Occupational History  . Not on file  Tobacco Use  . Smoking status: Never Smoker  . Smokeless tobacco: Never Used  Vaping Use  . Vaping Use: Never used  Substance and Sexual Activity  . Alcohol use: Yes    Alcohol/week: 20.0 standard drinks    Types: 20 Standard drinks or equivalent per week  . Drug use: Yes    Types: Marijuana  Comment: last use yesterday 29 October 2019  . Sexual activity: Not on file  Other Topics Concern  . Not on file  Social History Narrative  . Not on file   Social Determinants of Health   Financial Resource Strain: Not on file  Food Insecurity: Not on file  Transportation Needs: Not on file  Physical Activity: Not on file  Stress: Not on file  Social Connections: Not on file     Family History: The patient's family history includes Alzheimer's disease in his maternal grandmother; Diabetes in his sister and another family member; Diabetes (age of onset: 67) in his father; Healthy in his  son; Heart attack (age of onset: 76) in his father; Hypertension in his mother; Obesity in his father; Thyroid disease in his mother. There is no history of Colon cancer, Rectal cancer, Stomach cancer, Colon polyps, or Esophageal cancer. ROS:   Please see the history of present illness.    All 14 point review of systems negative except as described per history of present illness  EKGs/Labs/Other Studies Reviewed:      Recent Labs: 09/20/2019: ALT 38; BUN 20; Creatinine 0.90; Potassium 4.2; Sodium 131 03/15/2020: Hemoglobin 13.4; Platelet Count 319  Recent Lipid Panel    Component Value Date/Time   CHOL 227 (H) 01/30/2020 0841   TRIG 299 (H) 01/30/2020 0841   HDL 63 01/30/2020 0841   CHOLHDL 3.6 01/30/2020 0841   CHOLHDL 4 04/13/2019 0727   VLDL 45.4 (H) 04/13/2019 0727   LDLCALC 113 (H) 01/30/2020 0841   LDLDIRECT 146.0 04/13/2019 0727    Physical Exam:    VS:  BP 122/64 (BP Location: Left Arm, Patient Position: Sitting)   Pulse 66   Ht 6' (1.829 m)   Wt 215 lb (97.5 kg)   SpO2 95%   BMI 29.16 kg/m     Wt Readings from Last 3 Encounters:  05/07/20 215 lb (97.5 kg)  03/15/20 209 lb 0.6 oz (94.8 kg)  01/30/20 208 lb (94.3 kg)     GEN:  Well nourished, well developed in no acute distress HEENT: Normal NECK: No JVD; No carotid bruits LYMPHATICS: No lymphadenopathy CARDIAC: RRR, no murmurs, no rubs, no gallops RESPIRATORY:  Clear to auscultation without rales, wheezing or rhonchi  ABDOMEN: Soft, non-tender, non-distended MUSCULOSKELETAL:  No edema; No deformity  SKIN: Warm and dry LOWER EXTREMITIES: no swelling NEUROLOGIC:  Alert and oriented x 3 PSYCHIATRIC:  Normal affect   ASSESSMENT:    1. Angina pectoris ([place])   2. High cholesterol   3. Primary hypertension    PLAN:    In order of problems listed above:  1. Angina pectoris now denies having any but at the same time his ability to exercise is diminished because of chronic knee problem.  We have a long  multiple discussion about what to do with the situation and eventually since he required knee surgery for clarification I will schedule him to have coronary CT angio.  In the meantime we will continue with aspirin. 2. Dyslipidemia he is on high intense statin which I will continue last cholesterol have is from 01/30/2019 1K PN show me his LDL of 113 HDL 63.  I will schedule him to have another cholesterol done today. 3. Essential hypertension: Blood pressure well controlled continue present management. 4. Cardiovascular preop evaluation he did have typical angina pectoris likely improved symptomatology with improvement of anemia however his ability to exercise limited.  Evaluation before surgery he need to have look at his  coronary arteries.  I do not think we need to rush to cardiac catheterization with coronary CTA appears to be like an excellent idea which we will do.  If that test is fine then should be no problem proceeding with surgery.   Medication Adjustments/Labs and Tests Ordered: Current medicines are reviewed at length with the patient today.  Concerns regarding medicines are outlined above.  No orders of the defined types were placed in this encounter.  Medication changes: No orders of the defined types were placed in this encounter.   Signed, Park Liter, MD, [facility] 05/07/2020 8:47 AM    [place]

## 2020-05-07 NOTE — Addendum Note (Signed)
Addended by: Senaida Ores on: 05/07/2020 08:56 AM   Modules accepted: Orders

## 2020-05-08 LAB — LIPID PANEL
Chol/HDL Ratio: 4.6 ratio (ref 0.0–5.0)
Cholesterol, Total: 228 mg/dL — ABNORMAL HIGH (ref 100–199)
HDL: 50 mg/dL (ref >39)
LDL Chol Calc (NIH): 122 mg/dL — ABNORMAL HIGH (ref 0–99)
Triglycerides: 319 mg/dL — ABNORMAL HIGH (ref 0–149)
VLDL Cholesterol Cal: 56 mg/dL — ABNORMAL HIGH (ref 5–40)

## 2020-05-08 LAB — BASIC METABOLIC PANEL
BUN/Creatinine Ratio: 19 (ref 9–20)
BUN: 16 mg/dL (ref 6–24)
CO2: 25 mmol/L (ref 20–29)
Calcium: 9.8 mg/dL (ref 8.7–10.2)
Chloride: 97 mmol/L (ref 96–106)
Creatinine, Ser: 0.83 mg/dL (ref 0.76–1.27)
GFR calc Af Amer: 114 mL/min/{1.73_m2} (ref >59)
GFR calc non Af Amer: 98 mL/min/{1.73_m2} (ref >59)
Glucose: 79 mg/dL (ref 65–99)
Potassium: 4.5 mmol/L (ref 3.5–5.2)
Sodium: 137 mmol/L (ref 134–144)

## 2020-05-10 ENCOUNTER — Telehealth: Payer: Self-pay

## 2020-05-10 NOTE — Telephone Encounter (Signed)
Patient notified of results and verbalized understanding.  

## 2020-05-10 NOTE — Telephone Encounter (Signed)
-----   Message from Park Liter, MD sent at 05/09/2020 12:48 PM EST ----- Cholesterol still elevated, however will wait for coronary CT angio before making decision about treatment, kidney function good

## 2020-06-19 ENCOUNTER — Telehealth: Payer: Self-pay | Admitting: Family Medicine

## 2020-06-19 NOTE — Telephone Encounter (Signed)
Pt is requesting a referral for cardiologist, states the referral to Dr. Raliegh Ip pt never received call backs from them so he wants Dr. Nani Ravens to send him to someone else. Please advice.

## 2020-06-20 NOTE — Telephone Encounter (Signed)
Called pt back and left VM to call us back, but he does have an apt for 08/07/20 with Dr. Raliegh Ip. -JMA

## 2020-07-18 ENCOUNTER — Other Ambulatory Visit: Payer: Self-pay | Admitting: Family Medicine

## 2020-07-26 ENCOUNTER — Telehealth (HOSPITAL_COMMUNITY): Payer: Self-pay | Admitting: *Deleted

## 2020-07-26 ENCOUNTER — Telehealth (HOSPITAL_COMMUNITY): Payer: Self-pay | Admitting: Emergency Medicine

## 2020-07-26 NOTE — Telephone Encounter (Signed)
Pt returning call regarding upcoming cardiac imaging study; pt verbalizes understanding of appt date/time, parking situation and where to check in, pre-test NPO status and medications ordered, and verified current allergies; name and call back number provided for further questions should they arise  William Lagrand RN Navigator Cardiac Imaging [facility] [phone] office [phone] cell  Pt to take 100mg metoprolol tartrate 2 hours prior to cardiac CT scan. 

## 2020-07-26 NOTE — Telephone Encounter (Signed)
Attempted to call patient regarding upcoming cardiac CT appointment. °Left message on voicemail with name and callback number °Fatih Stalvey RN Navigator Cardiac Imaging °[facility] °[phone] Office °[phone] Cell ° °

## 2020-07-30 ENCOUNTER — Other Ambulatory Visit: Payer: Self-pay

## 2020-07-30 ENCOUNTER — Ambulatory Visit (HOSPITAL_COMMUNITY)
Admission: RE | Admit: 2020-07-30 | Discharge: 2020-07-30 | Disposition: A | Discharge status: Home / Self Care | Payer: No Typology Code available for payment source | Source: Ambulatory Visit | Attending: Cardiology | Admitting: Cardiology

## 2020-07-30 DIAGNOSIS — I10 Essential (primary) hypertension: Secondary | ICD-10-CM

## 2020-07-30 DIAGNOSIS — E7800 Pure hypercholesterolemia, unspecified: Secondary | ICD-10-CM

## 2020-07-30 DIAGNOSIS — I209 Angina pectoris, unspecified: Secondary | ICD-10-CM

## 2020-07-30 DIAGNOSIS — I2510 Atherosclerotic heart disease of native coronary artery without angina pectoris: Secondary | ICD-10-CM

## 2020-07-30 MED ORDER — NITROGLYCERIN 0.4 MG SL SUBL
0.4000 mg | SUBLINGUAL_TABLET | Freq: Once | SUBLINGUAL | Status: AC
  Administered 2020-07-30: 0.8 mg via SUBLINGUAL

## 2020-07-30 MED ORDER — IOHEXOL 350 MG/ML SOLN
95.0000 mL | Freq: Once | INTRAVENOUS | Status: AC | PRN
  Administered 2020-07-30: 95 mL via INTRAVENOUS

## 2020-07-30 MED ORDER — NITROGLYCERIN 0.4 MG SL SUBL
SUBLINGUAL_TABLET | SUBLINGUAL | Status: AC
  Filled 2020-07-30: qty 2

## 2020-07-31 DIAGNOSIS — I2510 Atherosclerotic heart disease of native coronary artery without angina pectoris: Secondary | ICD-10-CM | POA: Diagnosis not present

## 2020-08-07 ENCOUNTER — Ambulatory Visit (INDEPENDENT_AMBULATORY_CARE_PROVIDER_SITE_OTHER): Payer: No Typology Code available for payment source | Admitting: Cardiology

## 2020-08-07 ENCOUNTER — Encounter: Payer: Self-pay | Admitting: Cardiology

## 2020-08-07 ENCOUNTER — Other Ambulatory Visit: Payer: Self-pay

## 2020-08-07 VITALS — BP 106/62 | HR 76 | Ht 72.0 in | Wt 211.0 lb

## 2020-08-07 DIAGNOSIS — I209 Angina pectoris, unspecified: Secondary | ICD-10-CM | POA: Diagnosis not present

## 2020-08-07 DIAGNOSIS — E7800 Pure hypercholesterolemia, unspecified: Secondary | ICD-10-CM

## 2020-08-07 DIAGNOSIS — I2510 Atherosclerotic heart disease of native coronary artery without angina pectoris: Secondary | ICD-10-CM | POA: Insufficient documentation

## 2020-08-07 DIAGNOSIS — I10 Essential (primary) hypertension: Secondary | ICD-10-CM | POA: Diagnosis not present

## 2020-08-07 NOTE — Progress Notes (Signed)
EKG

## 2020-08-07 NOTE — Patient Instructions (Signed)
Medication Instructions:  Your physician recommends that you continue on your current medications as directed. Please refer to the Current Medication list given to you today.  *If you need a refill on your cardiac medications before your next appointment, please call your pharmacy*   Lab Work: Your physician recommends that you return for lab work today: lipid  If you have labs (blood work) drawn today and your tests are completely normal, you will receive your results only by: . MyChart Message (if you have MyChart) OR . A paper copy in the mail If you have any lab test that is abnormal or we need to change your treatment, we will call you to review the results.   Testing/Procedures: None   Follow-Up: At [facility], you and your health needs are our priority.  As part of our continuing mission to provide you with exceptional heart care, we have created designated Provider Care Teams.  These Care Teams include your primary Cardiologist (physician) and Advanced Practice Providers (APPs -  Physician Assistants and Nurse Practitioners) who all work together to provide you with the care you need, when you need it.  We recommend signing up for the patient portal called "MyChart".  Sign up information is provided on this After Visit Summary.  MyChart is used to connect with patients for Virtual Visits (Telemedicine).  Patients are able to view lab/test results, encounter notes, upcoming appointments, etc.  Non-urgent messages can be sent to your provider as well.   To learn more about what you can do with MyChart, go to [email or web address].    Your next appointment:   4 month(s)  The format for your next appointment:   In Person  Provider:   Robert Krasowski, MD   Other Instructions   

## 2020-08-07 NOTE — Progress Notes (Signed)
Cardiology Office Note:    Date:  08/07/2020   ID:  [name], DOB 1965/08/31, MRN [id number]  PCP:  Shelda Pal, DO  Cardiologist:  Jenne Campus, MD    Referring MD: Shelda Pal*   Chief Complaint  Patient presents with  . Results    No new sx's  I am doing well  History of Present Illness:    KIMBERLY COYE is a 55 y.o. male with past medical history of typical angina pectoris we are actually getting ready to do cardiac catheterization but then he was discovered to be significantly anemic that has been corrected and since that time he became asymptomatic possible history of dyslipidemia, essential hypertension.  Recently he did have coronary CT angio which showed hemodynamically significant lesion in the distal LAD.  He comes today to my office to talk about it.  He is perfectly asymptomatic he does have a chronic right knee problem however he is very busy he will walk a lot and he got no chest pain tightness squeezing pressure burning chest.  Past Medical History:  Diagnosis Date  . Allergy    seasonal  . Anemia   . Angina pectoris ([place]) 08/18/2019  . Anxiety and depression 02/07/2014  . Arthritis of knee 10/17/2016  . Cervical disc disorder with radiculopathy of cervical region 12/27/2015  . Colon cancer screening 02/07/2014  . High cholesterol   . Hyperlipidemia 12/27/2015  . Hypertension   . Intermittent explosive disorder 05/01/2014  . Iron deficiency anemia due to chronic blood loss 11/07/2019  . Prostate cancer screening 02/19/2017  . Rash and nonspecific skin eruption 02/19/2017  . Visit for preventive health examination 07/19/2013  . Weight gain 06/17/2016    Past Surgical History:  Procedure Laterality Date  . KNEE ARTHROSCOPY W/ MENISCAL REPAIR      Current Medications: Current Meds  Medication Sig  . aspirin EC 81 MG tablet Take 1 tablet (81 mg total) by mouth daily.  Marland Kitchen escitalopram (LEXAPRO) 10 MG tablet TAKE ONE TABLET BY MOUTH  ONE TIME DAILY (Patient taking differently: Take 10 mg by mouth daily.)  . fluticasone (FLONASE) 50 MCG/ACT nasal spray Place 2 sprays into both nostrils daily.  Marland Kitchen ibuprofen (ADVIL,MOTRIN) 200 MG tablet Take 200 mg by mouth every 6 (six) hours as needed for moderate pain.   Marland Kitchen lisinopril-hydrochlorothiazide (ZESTORETIC) 20-12.5 MG tablet TAKE ONE TABLET BY MOUTH TWICE A DAY (Patient taking differently: 1 tablet 2 (two) times daily.)  . Multiple Vitamins-Minerals (CENTRUM SILVER ADULT 50+) TABS Take 1 tablet by mouth daily. Unknown strength  . nitroGLYCERIN (NITROSTAT) 0.4 MG SL tablet Place 1 tablet (0.4 mg total) under the tongue every 5 (five) minutes as needed. (Patient taking differently: Place 0.4 mg under the tongue every 5 (five) minutes as needed for chest pain.)  . rosuvastatin (CRESTOR) 40 MG tablet TAKE ONE TABLET BY MOUTH ONE TIME DAILY (Patient taking differently: Take 40 mg by mouth daily.)     Allergies:   Patient has no known allergies.   Social History   Socioeconomic History  . Marital status: Married    Spouse name: Not on file  . Number of children: Not on file  . Years of education: Not on file  . Highest education level: Not on file  Occupational History  . Not on file  Tobacco Use  . Smoking status: Never Smoker  . Smokeless tobacco: Never Used  Vaping Use  . Vaping Use: Never used  Substance and Sexual  Activity  . Alcohol use: Yes    Alcohol/week: 20.0 standard drinks    Types: 20 Standard drinks or equivalent per week  . Drug use: Yes    Types: Marijuana    Comment: last use yesterday 29 October 2019  . Sexual activity: Not on file  Other Topics Concern  . Not on file  Social History Narrative  . Not on file   Social Determinants of Health   Financial Resource Strain: Not on file  Food Insecurity: Not on file  Transportation Needs: Not on file  Physical Activity: Not on file  Stress: Not on file  Social Connections: Not on file     Family  History: The patient's family history includes Alzheimer's disease in his maternal grandmother; Diabetes in his sister and another family member; Diabetes (age of onset: 27) in his father; Healthy in his son; Heart attack (age of onset: 15) in his father; Hypertension in his mother; Obesity in his father; Thyroid disease in his mother. There is no history of Colon cancer, Rectal cancer, Stomach cancer, Colon polyps, or Esophageal cancer. ROS:   Please see the history of present illness.    All 14 point review of systems negative except as described per history of present illness  EKGs/Labs/Other Studies Reviewed:      Recent Labs: 09/20/2019: ALT 38 03/15/2020: Hemoglobin 13.4; Platelet Count 319 05/07/2020: BUN 16; Creatinine, Ser 0.83; Potassium 4.5; Sodium 137  Recent Lipid Panel    Component Value Date/Time   CHOL 228 (H) 05/07/2020 0901   TRIG 319 (H) 05/07/2020 0901   HDL 50 05/07/2020 0901   CHOLHDL 4.6 05/07/2020 0901   CHOLHDL 4 04/13/2019 0727   VLDL 45.4 (H) 04/13/2019 0727   LDLCALC 122 (H) 05/07/2020 0901   LDLDIRECT 146.0 04/13/2019 0727    Physical Exam:    VS:  BP 106/62 (BP Location: Right Arm, Patient Position: Sitting)   Pulse 76   Ht 6' (1.829 m)   Wt 211 lb (95.7 kg)   SpO2 96%   BMI 28.62 kg/m     Wt Readings from Last 3 Encounters:  08/07/20 211 lb (95.7 kg)  05/07/20 215 lb (97.5 kg)  03/15/20 209 lb 0.6 oz (94.8 kg)     GEN:  Well nourished, well developed in no acute distress HEENT: Normal NECK: No JVD; No carotid bruits LYMPHATICS: No lymphadenopathy CARDIAC: RRR, no murmurs, no rubs, no gallops RESPIRATORY:  Clear to auscultation without rales, wheezing or rhonchi  ABDOMEN: Soft, non-tender, non-distended MUSCULOSKELETAL:  No edema; No deformity  SKIN: Warm and dry LOWER EXTREMITIES: no swelling NEUROLOGIC:  Alert and oriented x 3 PSYCHIATRIC:  Normal affect   ASSESSMENT:    1. Angina pectoris ([place])   2. Coronary artery disease  involving native coronary artery of native heart without angina pectoris   3. Primary hypertension   4. High cholesterol    PLAN:    In order of problems listed above:  1. Typical angina pectoris now completely asymptomatic after his anemia has been corrected I think the key is risk factors modifications right now since He is completely asymptomatic, I do not think we have indication for cardiac catheterization.  He is already on antiplatelet therapy which I will continue.  We will continue his statin.  I will check his fasting lipid profile today. 2. Essential hypertension, blood pressure well controlled continue present management. 3. Dyslipidemia, fasting lipid profile will be done. 4. Possible knee surgery.  Apparently his orthopedic surgeon is  not working anymore he is looking for a Chiropodist.  From my standpoint reviewed should be acceptable to perform surgery on this gentleman since he is completely asymptomatic even though he does have a distal lesion in his left anterior descending artery.  In spite of the fact that he have a new problem his exercise capacity is still excellent.   Medication Adjustments/Labs and Tests Ordered: Current medicines are reviewed at length with the patient today.  Concerns regarding medicines are outlined above.  No orders of the defined types were placed in this encounter.  Medication changes: No orders of the defined types were placed in this encounter.   Signed, Park Liter, MD, [facility] 08/07/2020 8:50 AM    [place]

## 2020-08-26 ENCOUNTER — Other Ambulatory Visit: Payer: Self-pay | Admitting: Family Medicine

## 2020-08-26 DIAGNOSIS — F4323 Adjustment disorder with mixed anxiety and depressed mood: Secondary | ICD-10-CM

## 2020-09-11 ENCOUNTER — Telehealth: Payer: Self-pay

## 2020-09-11 LAB — LIPID PANEL
Chol/HDL Ratio: 4 ratio (ref 0.0–5.0)
Cholesterol, Total: 195 mg/dL (ref 100–199)
HDL: 49 mg/dL (ref >39)
LDL Chol Calc (NIH): 109 mg/dL — ABNORMAL HIGH (ref 0–99)
Triglycerides: 213 mg/dL — ABNORMAL HIGH (ref 0–149)
VLDL Cholesterol Cal: 37 mg/dL (ref 5–40)

## 2020-09-11 NOTE — Telephone Encounter (Signed)
-----   Message from Park Liter, MD sent at 09/11/2020  2:25 PM EDT ----- Cholesterol still not well controlled.  Please add Zetia 10 mg daily to medical regimen, fasting lipid profile within the next 6 weeks with AST ALT

## 2020-09-11 NOTE — Telephone Encounter (Signed)
Patient notified of results and recommendation, he decided to hold off and will call if he decides to proceed.

## 2020-09-11 NOTE — Telephone Encounter (Signed)
Pt called in to r/s his appt from 09/13/20, done, Jaynia Fendley

## 2020-09-13 ENCOUNTER — Ambulatory Visit: Payer: No Typology Code available for payment source | Admitting: Family

## 2020-09-13 ENCOUNTER — Other Ambulatory Visit: Payer: No Typology Code available for payment source

## 2020-10-04 ENCOUNTER — Telehealth: Payer: Self-pay | Admitting: *Deleted

## 2020-10-04 ENCOUNTER — Other Ambulatory Visit: Payer: Self-pay | Admitting: Cardiology

## 2020-10-04 DIAGNOSIS — E785 Hyperlipidemia, unspecified: Secondary | ICD-10-CM

## 2020-10-04 NOTE — Telephone Encounter (Signed)
Rx approve and sent

## 2020-10-04 NOTE — Telephone Encounter (Signed)
Called patient and lvm about rescheduling appointment from 10/09/20 to 10/10/20 with Judson Roch - requested callback to confirm

## 2020-10-08 ENCOUNTER — Telehealth: Payer: Self-pay

## 2020-10-08 NOTE — Telephone Encounter (Signed)
Patient informed of PCP instructions. 

## 2020-10-08 NOTE — Telephone Encounter (Signed)
Pt called stating he tested Covid + on Saturday.  Felt the worst on Sunday.  He has been in quarantine and would just like some advice on when he can emerge back out safely and not get others sick.  What is his quarantine time.  Pt not requesting appt or medications, just advice.  Please advise.

## 2020-10-08 NOTE — Telephone Encounter (Signed)
After five full days (ie, day 6), certain patients can leave home isolation. These include patients who are asymptomatic (assuming no subsequent illness develops) and those with mild disease who have been afebrile for 24 hours and whose symptoms are resolving.

## 2020-10-09 ENCOUNTER — Inpatient Hospital Stay: Payer: No Typology Code available for payment source | Admitting: Family

## 2020-10-09 ENCOUNTER — Telehealth: Payer: Self-pay | Admitting: *Deleted

## 2020-10-09 ENCOUNTER — Inpatient Hospital Stay: Payer: No Typology Code available for payment source

## 2020-10-09 NOTE — Telephone Encounter (Signed)
Patient called to notify that he has Covid. His appointment with Judson Roch on 10/10/20 has been canceled until further notice.

## 2020-10-10 ENCOUNTER — Other Ambulatory Visit: Payer: No Typology Code available for payment source

## 2020-10-10 ENCOUNTER — Ambulatory Visit: Payer: No Typology Code available for payment source | Admitting: Family

## 2020-10-17 ENCOUNTER — Inpatient Hospital Stay: Payer: No Typology Code available for payment source | Attending: Hematology & Oncology

## 2020-10-17 ENCOUNTER — Other Ambulatory Visit: Payer: Self-pay

## 2020-10-17 DIAGNOSIS — K922 Gastrointestinal hemorrhage, unspecified: Secondary | ICD-10-CM | POA: Diagnosis not present

## 2020-10-17 DIAGNOSIS — D500 Iron deficiency anemia secondary to blood loss (chronic): Secondary | ICD-10-CM | POA: Diagnosis present

## 2020-10-17 LAB — CBC WITH DIFFERENTIAL (CANCER CENTER ONLY)
Abs Immature Granulocytes: 0.13 10*3/uL — ABNORMAL HIGH (ref 0.00–0.07)
Basophils Absolute: 0.1 10*3/uL (ref 0.0–0.1)
Basophils Relative: 1 %
Eosinophils Absolute: 0.6 10*3/uL — ABNORMAL HIGH (ref 0.0–0.5)
Eosinophils Relative: 7 %
HCT: 41.7 % (ref 39.0–52.0)
Hemoglobin: 14.8 g/dL (ref 13.0–17.0)
Immature Granulocytes: 2 %
Lymphocytes Relative: 26 %
Lymphs Abs: 2.2 10*3/uL (ref 0.7–4.0)
MCH: 32.2 pg (ref 26.0–34.0)
MCHC: 35.5 g/dL (ref 30.0–36.0)
MCV: 90.8 fL (ref 80.0–100.0)
Monocytes Absolute: 0.8 10*3/uL (ref 0.1–1.0)
Monocytes Relative: 10 %
Neutro Abs: 4.8 10*3/uL (ref 1.7–7.7)
Neutrophils Relative %: 54 %
Platelet Count: 330 10*3/uL (ref 150–400)
RBC: 4.59 MIL/uL (ref 4.22–5.81)
RDW: 11.7 % (ref 11.5–15.5)
WBC Count: 8.5 10*3/uL (ref 4.0–10.5)
nRBC: 0 % (ref 0.0–0.2)

## 2020-10-17 LAB — FERRITIN: Ferritin: 37 ng/mL (ref 24–336)

## 2020-10-17 LAB — IRON AND TIBC
Iron: 121 ug/dL (ref 42–163)
Saturation Ratios: 30 % (ref 20–55)
TIBC: 396 ug/dL (ref 202–409)
UIBC: 276 ug/dL (ref 117–376)

## 2020-10-17 LAB — RETICULOCYTES
Immature Retic Fract: 22.1 % — ABNORMAL HIGH (ref 2.3–15.9)
RBC.: 4.59 MIL/uL (ref 4.22–5.81)
Retic Count, Absolute: 92.7 10*3/uL (ref 19.0–186.0)
Retic Ct Pct: 2 % (ref 0.4–3.1)

## 2020-11-05 ENCOUNTER — Other Ambulatory Visit: Payer: Self-pay

## 2020-11-05 ENCOUNTER — Inpatient Hospital Stay: Payer: No Typology Code available for payment source | Attending: Family | Admitting: Family

## 2020-11-05 ENCOUNTER — Encounter: Payer: Self-pay | Admitting: Family

## 2020-11-05 VITALS — BP 125/73 | HR 71 | Temp 97.8°F | Resp 18 | Ht 72.0 in | Wt 210.1 lb

## 2020-11-05 DIAGNOSIS — K922 Gastrointestinal hemorrhage, unspecified: Secondary | ICD-10-CM | POA: Insufficient documentation

## 2020-11-05 DIAGNOSIS — D500 Iron deficiency anemia secondary to blood loss (chronic): Secondary | ICD-10-CM | POA: Insufficient documentation

## 2020-11-05 NOTE — Progress Notes (Signed)
Hematology and Oncology Follow Up Visit  William Shea [id number] 10/01/1965 55 y.o. 11/05/2020   Principle Diagnosis:  Iron deficiency anemia    Current Therapy:        IV iron as indicated    Interim History:  William Shea is here today for follow-up. He is doing well and has no complaints at this time.  He still has bright red blood in his stool occasionally with BM. He states that this is due to a hemorrhoid and needs to follow-up with GI. He recently had covid and also had to have some teeth extracted and implants placed. He states that he just hasn't had time recently to schedule.  No fever, chills, n/v, cough, rash, dizziness, SOB, chest, palpitations, abdominal pain or changes in bowel or bladder habits.  No swelling, tenderness, numbness or tingling in her extremities.  No falls or syncope to report.  He is eating well and is staying well hydrated throughout the day. His weight is stable at 210 lbs.   ECOG Performance Status: 0 - Asymptomatic  Medications:  Allergies as of 11/05/2020   No Known Allergies      Medication List        Accurate as of November 05, 2020  2:45 PM. If you have any questions, ask your nurse or doctor.          STOP taking these medications    triamcinolone cream 0.1 % Commonly known as: KENALOG Stopped by: Laverna Peace, NP       TAKE these medications    aspirin EC 81 MG tablet Take 1 tablet (81 mg total) by mouth daily.   Centrum Silver Adult 50+ Tabs Take 1 tablet by mouth daily. Unknown strength   escitalopram 10 MG tablet Commonly known as: LEXAPRO TAKE ONE TABLET BY MOUTH ONE TIME DAILY   fluticasone 50 MCG/ACT nasal spray Commonly known as: FLONASE Place 2 sprays into both nostrils daily.   ibuprofen 200 MG tablet Commonly known as: ADVIL Take 200 mg by mouth every 6 (six) hours as needed for moderate pain.   lisinopril-hydrochlorothiazide 20-12.5 MG tablet Commonly known as: ZESTORETIC TAKE ONE TABLET BY MOUTH  TWICE A DAY What changed: how to take this   nitroGLYCERIN 0.4 MG SL tablet Commonly known as: NITROSTAT Place 1 tablet (0.4 mg total) under the tongue every 5 (five) minutes as needed. What changed: reasons to take this   rosuvastatin 40 MG tablet Commonly known as: CRESTOR TAKE ONE TABLET BY MOUTH ONE TIME DAILY        Allergies: No Known Allergies  Past Medical History, Surgical history, Social history, and Family History were reviewed and updated.  Review of Systems: All other 10 point review of systems is negative.   Physical Exam:  height is 6' (1.829 m) and weight is 210 lb 1.6 oz (95.3 kg). His oral temperature is 97.8 F (36.6 C). His blood pressure is 125/73 and his pulse is 71. His respiration is 18 and oxygen saturation is 100%.   Wt Readings from Last 3 Encounters:  11/05/20 210 lb 1.6 oz (95.3 kg)  08/07/20 211 lb (95.7 kg)  05/07/20 215 lb (97.5 kg)    Ocular: Sclerae unicteric, pupils equal, round and reactive to light Ear-nose-throat: Oropharynx clear, dentition fair Lymphatic: No cervical or supraclavicular adenopathy Lungs no rales or rhonchi, good excursion bilaterally Heart regular rate and rhythm, no murmur appreciated Abd soft, nontender, positive bowel sounds MSK no focal spinal tenderness, no joint edema Neuro:  non-focal, well-oriented, appropriate affect Breasts: Deferred   Lab Results  Component Value Date   WBC 8.5 10/17/2020   HGB 14.8 10/17/2020   HCT 41.7 10/17/2020   MCV 90.8 10/17/2020   PLT 330 10/17/2020   Lab Results  Component Value Date   FERRITIN 37 10/17/2020   IRON 121 10/17/2020   TIBC 396 10/17/2020   UIBC 276 10/17/2020   IRONPCTSAT 30 10/17/2020   Lab Results  Component Value Date   RETICCTPCT 2.0 10/17/2020   RBC 4.59 10/17/2020   RBC 4.59 10/17/2020   No results found for: KPAFRELGTCHN, LAMBDASER, KAPLAMBRATIO No results found for: IGGSERUM, IGA, IGMSERUM No results found for: Odetta Pink, SPEI   Chemistry      Component Value Date/Time   NA 137 05/07/2020 0901   K 4.5 05/07/2020 0901   CL 97 05/07/2020 0901   CO2 25 05/07/2020 0901   BUN 16 05/07/2020 0901   CREATININE 0.83 05/07/2020 0901   CREATININE 0.90 09/20/2019 1519   CREATININE 0.97 08/30/2013 0910      Component Value Date/Time   CALCIUM 9.8 05/07/2020 0901   ALKPHOS 76 09/20/2019 1519   AST 47 (H) 09/20/2019 1519   ALT 38 09/20/2019 1519   BILITOT 0.5 09/20/2019 1519       Impression and Plan: William Shea is a very pleasant 55 yo caucasian gentleman with iron deficiency anemia felt to be due to intermittent GI blood loss.  Iron studies look good. No infusion needed at this time.  Follow-up in 6 months. He can contact our office with any questions or concerns.   Laverna Peace, NP 8/2/20222:45 PM

## 2020-11-06 ENCOUNTER — Telehealth: Payer: Self-pay | Admitting: *Deleted

## 2020-11-06 NOTE — Telephone Encounter (Signed)
Per 11/05/20 los called and gave upcoming appointments - confirmed - mailed calendar

## 2020-12-10 ENCOUNTER — Other Ambulatory Visit: Payer: Self-pay | Admitting: Family Medicine

## 2020-12-10 DIAGNOSIS — F4323 Adjustment disorder with mixed anxiety and depressed mood: Secondary | ICD-10-CM

## 2020-12-16 ENCOUNTER — Ambulatory Visit: Payer: No Typology Code available for payment source | Admitting: Cardiology

## 2021-01-14 ENCOUNTER — Other Ambulatory Visit: Payer: Self-pay | Admitting: Family Medicine

## 2021-01-14 DIAGNOSIS — F4323 Adjustment disorder with mixed anxiety and depressed mood: Secondary | ICD-10-CM

## 2021-01-28 ENCOUNTER — Encounter: Payer: Self-pay | Admitting: Family Medicine

## 2021-01-28 ENCOUNTER — Ambulatory Visit: Payer: No Typology Code available for payment source | Admitting: Family Medicine

## 2021-01-28 ENCOUNTER — Other Ambulatory Visit: Payer: Self-pay

## 2021-01-28 ENCOUNTER — Ambulatory Visit (HOSPITAL_BASED_OUTPATIENT_CLINIC_OR_DEPARTMENT_OTHER)
Admission: RE | Admit: 2021-01-28 | Discharge: 2021-01-28 | Disposition: A | Discharge status: Home / Self Care | Payer: No Typology Code available for payment source | Source: Ambulatory Visit | Attending: Family Medicine | Admitting: Family Medicine

## 2021-01-28 VITALS — BP 112/70 | HR 74 | Temp 99.1°F | Ht 72.0 in | Wt 211.4 lb

## 2021-01-28 DIAGNOSIS — R053 Chronic cough: Secondary | ICD-10-CM

## 2021-01-28 DIAGNOSIS — Z23 Encounter for immunization: Secondary | ICD-10-CM

## 2021-01-28 DIAGNOSIS — M25571 Pain in right ankle and joints of right foot: Secondary | ICD-10-CM | POA: Insufficient documentation

## 2021-01-28 DIAGNOSIS — I10 Essential (primary) hypertension: Secondary | ICD-10-CM

## 2021-01-28 DIAGNOSIS — F4323 Adjustment disorder with mixed anxiety and depressed mood: Secondary | ICD-10-CM | POA: Diagnosis not present

## 2021-01-28 MED ORDER — PANTOPRAZOLE SODIUM 40 MG PO TBEC: 40.0000 mg | DELAYED_RELEASE_TABLET | Freq: Every day | ORAL | 1 refills | Status: DC

## 2021-01-28 MED ORDER — ESCITALOPRAM OXALATE 10 MG PO TABS: ORAL_TABLET | ORAL | 2 refills | Status: DC

## 2021-01-28 MED ORDER — LISINOPRIL-HYDROCHLOROTHIAZIDE 20-12.5 MG PO TABS: 1.0000 | ORAL_TABLET | Freq: Two times a day (BID) | ORAL | 2 refills | Status: DC

## 2021-01-28 NOTE — Progress Notes (Signed)
Chief Complaint  Patient presents with   Cough    Morning only Medication refill   Follow-up   Ankle Pain    Right     William Shea is 55 y.o. and is here for a cough.  Duration: several months Productive? Yes Associated symptoms: mucus  Denies: fever, night sweats, nasal congestion, sore throat, dyspnea, wheezing Hx of GERD? No ACEi? Yes Takes Flonase for seasonal allergies.   Hypertension Patient presents for hypertension follow up. He does not monitor home blood pressures. He is compliant with medications- Zestoretic 20-12.5 mg bid. Patient has these side effects of medication: none He is adhering to a healthy diet overall. Exercise: cycling, active at work No Cp or SOB.   Anxiety/depression Patient stable on Lexapro 10 mg daily.  He reports compliance and no adverse effects.  He is not following with a counselor or psychologist.  Things in his life are much better than when he started.  He would like to continue.  No homicidal or suicidal ideation.  No self-medication.  1 week ago, the patient dropped a metal item on his right lower extremity.  It is bruising and bother him at night.  He is interested in getting an x-ray.  Walking is more difficult due to the pain.  No neurologic signs or symptoms.  Past Medical History:  Diagnosis Date   Allergy    seasonal   Anemia    Angina pectoris ([place]) 08/18/2019   Anxiety and depression 02/07/2014   Arthritis of knee 10/17/2016   Cervical disc disorder with radiculopathy of cervical region 12/27/2015   Colon cancer screening 02/07/2014   High cholesterol    Hyperlipidemia 12/27/2015   Hypertension    Intermittent explosive disorder 05/01/2014   Iron deficiency anemia due to chronic blood loss 11/07/2019   Prostate cancer screening 02/19/2017   Rash and nonspecific skin eruption 02/19/2017   Visit for preventive health examination 07/19/2013   Weight gain 06/17/2016   Family History  Problem Relation Age of Onset    Hypertension Mother        Living   Thyroid disease Mother    Diabetes Father 9       Deceased   Obesity Father    Heart attack Father 74   Diabetes Other        Paternal Side of Family   Alzheimer's disease Maternal Grandmother    Diabetes Sister    Healthy Son        x3   Colon cancer Neg Hx    Rectal cancer Neg Hx    Stomach cancer Neg Hx    Colon polyps Neg Hx    Esophageal cancer Neg Hx    Allergies as of 01/28/2021   No Known Allergies      Medication List        Accurate as of January 28, 2021 12:16 PM. If you have any questions, ask your nurse or doctor.          aspirin EC 81 MG tablet Take 1 tablet (81 mg total) by mouth daily.   Centrum Silver Adult 50+ Tabs Take 1 tablet by mouth daily. Unknown strength   escitalopram 10 MG tablet Commonly known as: LEXAPRO TAKE 1 TABLET BY MOUTH ONCE DAILY . What changed: See the new instructions. Changed by: Shelda Pal, DO   fluticasone 50 MCG/ACT nasal spray Commonly known as: FLONASE Place 2 sprays into both nostrils daily.   ibuprofen 200 MG tablet Commonly known as:  ADVIL Take 200 mg by mouth every 6 (six) hours as needed for moderate pain.   lisinopril-hydrochlorothiazide 20-12.5 MG tablet Commonly known as: ZESTORETIC Take 1 tablet by mouth 2 (two) times daily. What changed: how to take this   nitroGLYCERIN 0.4 MG SL tablet Commonly known as: NITROSTAT Place 1 tablet (0.4 mg total) under the tongue every 5 (five) minutes as needed. What changed: reasons to take this   pantoprazole 40 MG tablet Commonly known as: PROTONIX Take 1 tablet (40 mg total) by mouth daily. Started by: Shelda Pal, DO   rosuvastatin 40 MG tablet Commonly known as: CRESTOR TAKE ONE TABLET BY MOUTH ONE TIME DAILY        BP 112/70   Pulse 74   Temp 99.1 F (37.3 C) (Oral)   Ht 6' (1.829 m)   Wt 211 lb 6 oz (95.9 kg)   SpO2 99%   BMI 28.67 kg/m  Gen: Awake, alert, appears stated  age HEENT: Ears neg, nares patent without D/C, turbinates unremarkable, Pharynx pink without exudate Neck: Supple, no masses or asymmetry, no tenderness Abdomen: Bowel sounds present, soft, nontender, nondistended, no masses or organomegaly Heart: RRR, no LE edema Lungs: CTAB, normal effort, no accessory muscle use MSK: Ecchymosis and tenderness to palpation over the distal anterior medial tibia on the right; no deformity or crepitus Psych: Age appropriate judgement and insight, normal mood and affect  Chronic cough - Plan: pantoprazole (PROTONIX) 40 MG tablet  Situational mixed anxiety and depressive disorder - Plan: escitalopram (LEXAPRO) 10 MG tablet  Primary hypertension - Plan: lisinopril-hydrochlorothiazide (ZESTORETIC) 20-12.5 MG tablet  Acute right ankle pain - Plan: DG Ankle Complete Right  Need for influenza vaccination - Plan: Flu Vaccine QUAD 6+ mos PF IM (Fluarix Quad PF)  Chronic, uncontrolled.  Trial Protonix.  If no improvement in 2 months, will add Xyzal/Zyrtec to his Flonase.  We will reconvene if no improvement as we will change his Zestoretic to Hyzaar. Chronic, stable.  Continue Lexapro 10 mg daily. Chronic, stable.  Continue Zestoretic 20-12.5 mg twice daily.  Counseled on diet and exercise. New problem, check x-ray.  If normal, will recommend supportive care.  Should be self-limited. Follow-up in 6 months for physical. The patient voiced understanding and agreement to the plan.  Armstrong, DO 01/28/21 12:16 PM

## 2021-01-28 NOTE — Patient Instructions (Addendum)
Keep the diet clean and stay active.  I recommend getting the updated bivalent covid vaccination booster at your convenience.   Try the Protonix for 2 months. If no improvement, consider adding Xyzal/Zyrtec to the [facility] for 1-2 months. Reconvene with me if still no improvement.   We will be in touch regarding your X-ray results in the next 2-3 days.   Let us know if you need anything.

## 2021-02-23 ENCOUNTER — Other Ambulatory Visit: Payer: Self-pay | Admitting: Family Medicine

## 2021-05-09 ENCOUNTER — Inpatient Hospital Stay: Payer: No Typology Code available for payment source | Admitting: Family

## 2021-05-09 ENCOUNTER — Inpatient Hospital Stay: Payer: No Typology Code available for payment source | Attending: Hematology & Oncology

## 2021-07-14 ENCOUNTER — Other Ambulatory Visit: Payer: Self-pay | Admitting: Cardiology

## 2021-07-14 DIAGNOSIS — E785 Hyperlipidemia, unspecified: Secondary | ICD-10-CM

## 2021-07-29 ENCOUNTER — Other Ambulatory Visit: Payer: Self-pay | Admitting: Family Medicine

## 2021-07-29 ENCOUNTER — Encounter: Payer: Self-pay | Admitting: Family Medicine

## 2021-07-29 ENCOUNTER — Ambulatory Visit (INDEPENDENT_AMBULATORY_CARE_PROVIDER_SITE_OTHER): Payer: No Typology Code available for payment source | Admitting: Family Medicine

## 2021-07-29 VITALS — BP 128/72 | HR 65 | Temp 98.0°F | Ht 72.0 in | Wt 214.0 lb

## 2021-07-29 DIAGNOSIS — Z0000 Encounter for general adult medical examination without abnormal findings: Secondary | ICD-10-CM

## 2021-07-29 DIAGNOSIS — Z125 Encounter for screening for malignant neoplasm of prostate: Secondary | ICD-10-CM | POA: Diagnosis not present

## 2021-07-29 DIAGNOSIS — E7800 Pure hypercholesterolemia, unspecified: Secondary | ICD-10-CM

## 2021-07-29 DIAGNOSIS — D509 Iron deficiency anemia, unspecified: Secondary | ICD-10-CM

## 2021-07-29 LAB — COMPREHENSIVE METABOLIC PANEL
ALT: 32 U/L (ref 0–53)
AST: 33 U/L (ref 0–37)
Albumin: 5.1 g/dL (ref 3.5–5.2)
Alkaline Phosphatase: 116 U/L (ref 39–117)
BUN: 18 mg/dL (ref 6–23)
CO2: 27 mEq/L (ref 19–32)
Calcium: 9.9 mg/dL (ref 8.4–10.5)
Chloride: 95 mEq/L — ABNORMAL LOW (ref 96–112)
Creatinine, Ser: 0.92 mg/dL (ref 0.40–1.50)
GFR: 92.13 mL/min (ref >60.00)
Glucose, Bld: 113 mg/dL — ABNORMAL HIGH (ref 70–99)
Potassium: 4.7 mEq/L (ref 3.5–5.1)
Sodium: 134 mEq/L — ABNORMAL LOW (ref 135–145)
Total Bilirubin: 0.5 mg/dL (ref 0.2–1.2)
Total Protein: 8 g/dL (ref 6.0–8.3)

## 2021-07-29 LAB — LIPID PANEL
Cholesterol: 220 mg/dL — ABNORMAL HIGH (ref 0–200)
HDL: 59.5 mg/dL (ref >39.00)
NonHDL: 160.22
Total CHOL/HDL Ratio: 4
Triglycerides: 256 mg/dL — ABNORMAL HIGH (ref 0.0–149.0)
VLDL: 51.2 mg/dL — ABNORMAL HIGH (ref 0.0–40.0)

## 2021-07-29 LAB — CBC
HCT: 36.8 % — ABNORMAL LOW (ref 39.0–52.0)
Hemoglobin: 12.1 g/dL — ABNORMAL LOW (ref 13.0–17.0)
MCHC: 32.9 g/dL (ref 30.0–36.0)
MCV: 84.8 fl (ref 78.0–100.0)
Platelets: 404 10*3/uL — ABNORMAL HIGH (ref 150.0–400.0)
RBC: 4.34 Mil/uL (ref 4.22–5.81)
RDW: 14.7 % (ref 11.5–15.5)
WBC: 6.3 10*3/uL (ref 4.0–10.5)

## 2021-07-29 LAB — LDL CHOLESTEROL, DIRECT: Direct LDL: 130 mg/dL

## 2021-07-29 LAB — PSA: PSA: 0.48 ng/mL (ref 0.10–4.00)

## 2021-07-29 MED ORDER — LEVOCETIRIZINE DIHYDROCHLORIDE 5 MG PO TABS: 5.0000 mg | ORAL_TABLET | Freq: Every evening | ORAL | 2 refills | Status: DC

## 2021-07-29 NOTE — Patient Instructions (Addendum)
Give [place] 2-3 business days to get the results of your labs back.  ? ?Keep the diet clean and stay active. ? ?Please get me a copy of your advanced directive form at your convenience.  ? ?The Shingrix vaccine (for shingles) is a 2 shot series spaced 2-6 months apart. It can make people feel low energy, achy and almost like they have the flu for 48 hours after injection. 1/5 people can have nausea and/or vomiting. Please plan accordingly when deciding on when to get this shot. Call our office for a nurse visit appointment to get this. The second shot of the series is less severe regarding the side effects, but it still lasts 48 hours.  ? ?Claritin (loratadine), Allegra (fexofenadine), Zyrtec (cetirizine) which is also equivalent to Xyzal (levocetirizine); these are listed in order from weakest to strongest. Generic, and therefore cheaper, options are in the parentheses.  ? ?There are available OTC, and the generic versions, which may be cheaper, are in parentheses. Show this to a pharmacist if you have trouble finding any of these items. ? ?Let us know if you need anything. ?

## 2021-07-29 NOTE — Progress Notes (Signed)
Chief Complaint  ?Patient presents with  ? Annual Exam  ? ? ?Well Male ?William Shea is here for a complete physical.   ?His last physical was >1 year ago.  ?Current diet: in general, diet has been better.  ?Current exercise: active at work ?Weight trend: up a few lbs ?Fatigue out of ordinary? No. ?Seat belt? Yes.   ?Advanced directive? No ? ?Health maintenance ?Shingrix- No ?Colonoscopy- Yes ?Tetanus- Yes ?HIV- Yes ?Hep C- Yes ?  ?Past Medical History:  ?Diagnosis Date  ? Allergy   ? seasonal  ? Anemia   ? Angina pectoris ([place]) 08/18/2019  ? Anxiety and depression 02/07/2014  ? Arthritis of knee 10/17/2016  ? Cervical disc disorder with radiculopathy of cervical region 12/27/2015  ? Colon cancer screening 02/07/2014  ? High cholesterol   ? Hyperlipidemia 12/27/2015  ? Hypertension   ? Intermittent explosive disorder 05/01/2014  ? Iron deficiency anemia due to chronic blood loss 11/07/2019  ? Prostate cancer screening 02/19/2017  ? Rash and nonspecific skin eruption 02/19/2017  ? Visit for preventive health examination 07/19/2013  ? Weight gain 06/17/2016  ?  ? ? ?Past Surgical History:  ?Procedure Laterality Date  ? KNEE ARTHROSCOPY W/ MENISCAL REPAIR    ? ? ?Medications  ?Current Outpatient Medications on File Prior to Visit  ?Medication Sig Dispense Refill  ? aspirin EC 81 MG tablet Take 1 tablet (81 mg total) by mouth daily. 90 tablet 3  ? escitalopram (LEXAPRO) 10 MG tablet TAKE 1 TABLET BY MOUTH ONCE DAILY . 90 tablet 2  ? fluticasone (FLONASE) 50 MCG/ACT nasal spray Use 2 spray(s) in each nostril once daily 16 g 0  ? ibuprofen (ADVIL,MOTRIN) 200 MG tablet Take 200 mg by mouth every 6 (six) hours as needed for moderate pain.     ? lisinopril-hydrochlorothiazide (ZESTORETIC) 20-12.5 MG tablet Take 1 tablet by mouth 2 (two) times daily. 180 tablet 2  ? Multiple Vitamins-Minerals (CENTRUM SILVER ADULT 50+) TABS Take 1 tablet by mouth daily. Unknown strength    ? pantoprazole (PROTONIX) 40 MG tablet Take 1 tablet (40 mg  total) by mouth daily. 30 tablet 1  ? rosuvastatin (CRESTOR) 40 MG tablet Take 1 tablet by mouth once daily 30 tablet 0  ? nitroGLYCERIN (NITROSTAT) 0.4 MG SL tablet Place 1 tablet (0.4 mg total) under the tongue every 5 (five) minutes as needed. (Patient taking differently: Place 0.4 mg under the tongue every 5 (five) minutes as needed for chest pain.) 25 tablet 11  ? ?Allergies ?No Known Allergies ? ?Family History ?Family History  ?Problem Relation Age of Onset  ? Hypertension Mother   ?     Living  ? Thyroid disease Mother   ? Diabetes Father 9  ?     Deceased  ? Obesity Father   ? Heart attack Father 77  ? Diabetes Other   ?     Paternal Side of Family  ? Alzheimer's disease Maternal Grandmother   ? Diabetes Sister   ? Healthy Son   ?     x3  ? Colon cancer Neg Hx   ? Rectal cancer Neg Hx   ? Stomach cancer Neg Hx   ? Colon polyps Neg Hx   ? Esophageal cancer Neg Hx   ? ? ?Review of Systems: ?Constitutional:  no fevers ?Eye:  no recent significant change in vision ?Ear/Nose/Mouth/Throat:  Ears:  no hearing loss ?Nose/Mouth/Throat:  no complaints of nasal congestion, no sore throat ?Cardiovascular:  no  chest pain ?Respiratory:  no shortness of breath ?Gastrointestinal:  no change in bowel habits ?GU:  Male: negative for dysuria, frequency ?Musculoskeletal/Extremities:  no joint pain ?Integumentary (Skin/Breast):  +red bumps (saw derm and was given steroid injection that did not work 1 week ago) ?Neurologic:  no headaches ?Endocrine: No unexpected weight changes ?Hematologic/Lymphatic:  no abnormal bleeding ? ?Exam ?BP 128/72   Pulse 65   Temp 98 ?F (36.7 ?C) (Oral)   Ht 6' (1.829 m)   Wt 214 lb (97.1 kg)   SpO2 98%   BMI 29.02 kg/m?  ?General:  well developed, well nourished, in no apparent distress ?Skin: erythematous excoriated papules over forearms and legs. No inter-web lesions. No drainage.  ?Head:  no masses, lesions, or tenderness ?Eyes:  pupils equal and round, sclera anicteric without  injection ?Ears:  canals without lesions, TMs shiny without retraction, no obvious effusion, no erythema ?Nose:  nares patent, septum midline, mucosa normal ?Throat/Pharynx:  lips and gingiva without lesion; tongue and uvula midline; non-inflamed pharynx; no exudates or postnasal drainage ?Neck: neck supple without adenopathy, thyromegaly, or masses ?Cardiac: RRR, no bruits, no LE edema ?Lungs:  clear to auscultation, breath sounds equal bilaterally, no respiratory distress ?Abdomen: BS+, soft, non-tender, non-distended, no masses or organomegaly noted ?Rectal: Deferred ?Musculoskeletal:  symmetrical muscle groups noted without atrophy or deformity ?Neuro:  gait normal; deep tendon reflexes normal and symmetric ?Psych: well oriented with normal range of affect and appropriate judgment/insight ? ?Assessment and Plan ? ?Well adult exam - Plan: CBC, Comprehensive metabolic panel, Lipid panel ? ?Screening for prostate cancer - Plan: PSA  ? ?Well 56 y.o. male. ?Counseled on diet and exercise. ?Counseled on risks and benefits of prostate cancer screening with PSA. The patient agrees to undergo testing. ?Shingrix rec'd. ?F/u w derm. PO antihist rec'd in meanwhile. Cont TAO over open lesions.  ?Advanced directive form provided today.  ?Immunizations, labs, and further orders as above. ?Follow up in 6 mo or prn. ?The patient voiced understanding and agreement to the plan. ? ?Shelda Pal, DO ?07/29/21 ?7:58 AM ? ?

## 2021-08-17 ENCOUNTER — Other Ambulatory Visit: Payer: Self-pay | Admitting: Cardiology

## 2021-08-17 DIAGNOSIS — E785 Hyperlipidemia, unspecified: Secondary | ICD-10-CM

## 2021-08-22 NOTE — Addendum Note (Signed)
Addended by: Kelle Darting A on: 08/22/2021 03:25 PM   Modules accepted: Orders

## 2021-08-26 ENCOUNTER — Other Ambulatory Visit: Payer: No Typology Code available for payment source

## 2021-08-27 ENCOUNTER — Other Ambulatory Visit (INDEPENDENT_AMBULATORY_CARE_PROVIDER_SITE_OTHER): Payer: No Typology Code available for payment source

## 2021-08-27 ENCOUNTER — Ambulatory Visit: Payer: No Typology Code available for payment source | Admitting: Family Medicine

## 2021-08-27 ENCOUNTER — Encounter: Payer: Self-pay | Admitting: Family Medicine

## 2021-08-27 VITALS — BP 130/82 | HR 79 | Temp 98.9°F | Ht 72.0 in | Wt 219.2 lb

## 2021-08-27 DIAGNOSIS — D509 Iron deficiency anemia, unspecified: Secondary | ICD-10-CM | POA: Diagnosis not present

## 2021-08-27 DIAGNOSIS — E7800 Pure hypercholesterolemia, unspecified: Secondary | ICD-10-CM

## 2021-08-27 DIAGNOSIS — L282 Other prurigo: Secondary | ICD-10-CM | POA: Diagnosis not present

## 2021-08-27 LAB — LIPID PANEL
Cholesterol: 275 mg/dL — ABNORMAL HIGH (ref 0–200)
HDL: 64.2 mg/dL (ref >39.00)
NonHDL: 211.17
Total CHOL/HDL Ratio: 4
Triglycerides: 261 mg/dL — ABNORMAL HIGH (ref 0.0–149.0)
VLDL: 52.2 mg/dL — ABNORMAL HIGH (ref 0.0–40.0)

## 2021-08-27 LAB — HEMOGLOBIN A1C: Hgb A1c MFr Bld: 6.4 % (ref 4.6–6.5)

## 2021-08-27 LAB — LDL CHOLESTEROL, DIRECT: Direct LDL: 180 mg/dL

## 2021-08-27 MED ORDER — PREDNISONE 20 MG PO TABS: 40.0000 mg | ORAL_TABLET | Freq: Every day | ORAL | 0 refills | Status: AC

## 2021-08-27 NOTE — Patient Instructions (Signed)
If you do not hear anything about your referral in the next 1-2 days, call our office and ask for an update.  Let me know when your appointment is.   Let us know if you need anything.

## 2021-08-27 NOTE — Progress Notes (Signed)
Chief Complaint  Patient presents with   Rash    William Shea is a 56 y.o. male here for a skin complaint.  Duration: several months; worsening after coming off of prednisone from derm Location: arms, legs, armpits, upper back, lower abd region Pruritic? Yes Painful? No Drainage? No New soaps/lotions/topicals/detergents? Tide pods, but he switched those out for non-scented products Sick contacts? No Other associated symptoms: scales and will sometimes bleed after scratching Therapies tried thus far: prednisone, Depo shot, Xyzal  Past Medical History:  Diagnosis Date   Allergy    seasonal   Anemia    Angina pectoris ([place]) 08/18/2019   Anxiety and depression 02/07/2014   Arthritis of knee 10/17/2016   Cervical disc disorder with radiculopathy of cervical region 12/27/2015   Colon cancer screening 02/07/2014   High cholesterol    Hyperlipidemia 12/27/2015   Hypertension    Intermittent explosive disorder 05/01/2014   Iron deficiency anemia due to chronic blood loss 11/07/2019   Prostate cancer screening 02/19/2017   Rash and nonspecific skin eruption 02/19/2017   Visit for preventive health examination 07/19/2013   Weight gain 06/17/2016    BP 130/82   Pulse 79   Temp 98.9 F (37.2 C) (Oral)   Ht 6' (1.829 m)   Wt 219 lb 4 oz (99.5 kg)   SpO2 98%   BMI 29.74 kg/m  Gen: awake, alert, appearing stated age Lungs: No accessory muscle use Skin: see below. No drainage, erythema, TTP, fluctuance Psych: Age appropriate judgment and insight         Pruritic rash - Plan: Ambulatory referral to Dermatology, predniSONE (DELTASONE) 20 MG tablet  Exacerbation of chronic issue.  5-day prednisone burst 40 mg daily.  Urgent referral to dermatology placed given recent flare.  Previous dermatologist thought it was psoriasis.  He received an injection in the office but there were multiple drops on the ground and his pants were wet leading him to believe he did not receive enough  medicine.  He would like a referral to a new office as he does not trust them any longer. F/u prn. The patient voiced understanding and agreement to the plan.  [place], DO 08/27/21 12:18 PM

## 2021-08-28 LAB — CBC WITH DIFFERENTIAL/PLATELET
Absolute Monocytes: 599 cells/uL (ref 200–950)
Basophils Absolute: 78 cells/uL (ref 0–200)
Basophils Relative: 1.4 %
Eosinophils Absolute: 431 cells/uL (ref 15–500)
Eosinophils Relative: 7.7 %
HCT: 34.1 % — ABNORMAL LOW (ref 38.5–50.0)
Hemoglobin: 11.2 g/dL — ABNORMAL LOW (ref 13.2–17.1)
Lymphs Abs: 1686 cells/uL (ref 850–3900)
MCH: 27.9 pg (ref 27.0–33.0)
MCHC: 32.8 g/dL (ref 32.0–36.0)
MCV: 84.8 fL (ref 80.0–100.0)
MPV: 9 fL (ref 7.5–12.5)
Monocytes Relative: 10.7 %
Neutro Abs: 2806 cells/uL (ref 1500–7800)
Neutrophils Relative %: 50.1 %
Platelets: 363 10*3/uL (ref 140–400)
RBC: 4.02 10*6/uL — ABNORMAL LOW (ref 4.20–5.80)
RDW: 14.5 % (ref 11.0–15.0)
Total Lymphocyte: 30.1 %
WBC: 5.6 10*3/uL (ref 3.8–10.8)

## 2021-08-28 LAB — PATHOLOGIST SMEAR REVIEW

## 2021-08-29 ENCOUNTER — Other Ambulatory Visit (INDEPENDENT_AMBULATORY_CARE_PROVIDER_SITE_OTHER): Payer: No Typology Code available for payment source

## 2021-08-29 ENCOUNTER — Other Ambulatory Visit: Payer: Self-pay | Admitting: Family Medicine

## 2021-08-29 DIAGNOSIS — D509 Iron deficiency anemia, unspecified: Secondary | ICD-10-CM | POA: Diagnosis not present

## 2021-08-29 LAB — IBC + FERRITIN
Ferritin: 12.2 ng/mL — ABNORMAL LOW (ref 22.0–322.0)
Iron: 46 ug/dL (ref 42–165)
Saturation Ratios: 8.9 % — ABNORMAL LOW (ref 20.0–50.0)
TIBC: 519.4 ug/dL — ABNORMAL HIGH (ref 250.0–450.0)
Transferrin: 371 mg/dL — ABNORMAL HIGH (ref 212.0–360.0)

## 2021-09-02 ENCOUNTER — Encounter: Payer: Self-pay | Admitting: Family Medicine

## 2021-09-02 ENCOUNTER — Other Ambulatory Visit: Payer: Self-pay | Admitting: Family Medicine

## 2021-09-02 DIAGNOSIS — E785 Hyperlipidemia, unspecified: Secondary | ICD-10-CM

## 2021-09-02 MED ORDER — FENOFIBRATE 48 MG PO TABS: 48.0000 mg | ORAL_TABLET | Freq: Every day | ORAL | 3 refills | Status: DC

## 2021-09-11 ENCOUNTER — Encounter: Payer: Self-pay | Admitting: Family Medicine

## 2021-10-09 ENCOUNTER — Encounter: Payer: Self-pay | Admitting: Family

## 2021-10-14 ENCOUNTER — Inpatient Hospital Stay: Payer: No Typology Code available for payment source

## 2021-10-14 ENCOUNTER — Inpatient Hospital Stay: Payer: No Typology Code available for payment source | Admitting: Family

## 2021-10-15 ENCOUNTER — Inpatient Hospital Stay: Payer: No Typology Code available for payment source | Attending: Hematology & Oncology

## 2021-10-15 ENCOUNTER — Inpatient Hospital Stay: Payer: No Typology Code available for payment source | Admitting: Family

## 2021-10-15 ENCOUNTER — Encounter: Payer: Self-pay | Admitting: Family

## 2021-10-15 VITALS — BP 120/66 | HR 71 | Temp 98.1°F | Resp 20 | Wt 225.0 lb

## 2021-10-15 DIAGNOSIS — D500 Iron deficiency anemia secondary to blood loss (chronic): Secondary | ICD-10-CM | POA: Diagnosis not present

## 2021-10-15 DIAGNOSIS — D509 Iron deficiency anemia, unspecified: Secondary | ICD-10-CM | POA: Insufficient documentation

## 2021-10-15 LAB — RETICULOCYTES
Immature Retic Fract: 28.6 % — ABNORMAL HIGH (ref 2.3–15.9)
RBC.: 3.97 MIL/uL — ABNORMAL LOW (ref 4.22–5.81)
Retic Count, Absolute: 83.8 10*3/uL (ref 19.0–186.0)
Retic Ct Pct: 2.1 % (ref 0.4–3.1)

## 2021-10-15 LAB — CBC WITH DIFFERENTIAL (CANCER CENTER ONLY)
Abs Immature Granulocytes: 0.03 10*3/uL (ref 0.00–0.07)
Basophils Absolute: 0.1 10*3/uL (ref 0.0–0.1)
Basophils Relative: 1 %
Eosinophils Absolute: 0.4 10*3/uL (ref 0.0–0.5)
Eosinophils Relative: 4 %
HCT: 33.5 % — ABNORMAL LOW (ref 39.0–52.0)
Hemoglobin: 10.8 g/dL — ABNORMAL LOW (ref 13.0–17.0)
Immature Granulocytes: 0 %
Lymphocytes Relative: 21 %
Lymphs Abs: 1.8 10*3/uL (ref 0.7–4.0)
MCH: 27 pg (ref 26.0–34.0)
MCHC: 32.2 g/dL (ref 30.0–36.0)
MCV: 83.8 fL (ref 80.0–100.0)
Monocytes Absolute: 0.8 10*3/uL (ref 0.1–1.0)
Monocytes Relative: 10 %
Neutro Abs: 5.3 10*3/uL (ref 1.7–7.7)
Neutrophils Relative %: 64 %
Platelet Count: 333 10*3/uL (ref 150–400)
RBC: 4 MIL/uL — ABNORMAL LOW (ref 4.22–5.81)
RDW: 15.3 % (ref 11.5–15.5)
WBC Count: 8.4 10*3/uL (ref 4.0–10.5)
nRBC: 0 % (ref 0.0–0.2)

## 2021-10-15 LAB — FERRITIN: Ferritin: 8 ng/mL — ABNORMAL LOW (ref 24–336)

## 2021-10-15 NOTE — Progress Notes (Signed)
Hematology and Oncology Follow Up Visit  William Shea [id number] 06-Oct-1965 56 y.o. 10/15/2021   Principle Diagnosis:  Iron deficiency anemia    Current Therapy:        IV iron as indicated    Interim History:  William Shea is here today for follow-up. He is symptomatic with fatigue and SOB with exertion.  He notes minimal blood loss with constipation and straining with hemorrhoids.  No other blood loss noted. No bruising or petechiae.  No fever, chills, n/v, cough, dizziness, chest pain, palpitations, abdominal pain or changes in bowel or bladder habits.  He was recently diagnosed with psoriasis and will do Skyrizi injections once every 3 months.  No swelling, tenderness, numbness or tingling in his extremities.  No falls or syncope.  Appetite and hydration have been good. His weight is stable at 225 lbs.   ECOG Performance Status: 1 - Symptomatic but completely ambulatory  Medications:  Allergies as of 10/15/2021   No Known Allergies      Medication List        Accurate as of October 15, 2021  3:17 PM. If you have any questions, ask your nurse or doctor.          aspirin EC 81 MG tablet Take 1 tablet (81 mg total) by mouth daily.   Centrum Silver Adult 50+ Tabs Take 1 tablet by mouth daily. Unknown strength   escitalopram 10 MG tablet Commonly known as: LEXAPRO TAKE 1 TABLET BY MOUTH ONCE DAILY .   fenofibrate 48 MG tablet Commonly known as: Tricor Take 1 tablet (48 mg total) by mouth daily.   fluticasone 50 MCG/ACT nasal spray Commonly known as: FLONASE Use 2 spray(s) in each nostril once daily   ibuprofen 200 MG tablet Commonly known as: ADVIL Take 200 mg by mouth every 6 (six) hours as needed for moderate pain.   levocetirizine 5 MG tablet Commonly known as: XYZAL Take 1 tablet (5 mg total) by mouth every evening.   lisinopril-hydrochlorothiazide 20-12.5 MG tablet Commonly known as: ZESTORETIC Take 1 tablet by mouth 2 (two) times daily.    nitroGLYCERIN 0.4 MG SL tablet Commonly known as: NITROSTAT Place 1 tablet (0.4 mg total) under the tongue every 5 (five) minutes as needed. What changed: reasons to take this   pantoprazole 40 MG tablet Commonly known as: PROTONIX Take 1 tablet (40 mg total) by mouth daily.   rosuvastatin 40 MG tablet Commonly known as: CRESTOR Take 1 tablet by mouth once daily   SKYRIZI PEN [place] Inject into the skin. 4 times a year        Allergies: No Known Allergies  Past Medical History, Surgical history, Social history, and Family History were reviewed and updated.  Review of Systems: All other 10 point review of systems is negative.   Physical Exam:  weight is 225 lb (102.1 kg). His oral temperature is 98.1 F (36.7 C). His blood pressure is 120/66 and his pulse is 71. His respiration is 20 and oxygen saturation is 99%.   Wt Readings from Last 3 Encounters:  10/15/21 225 lb (102.1 kg)  08/27/21 219 lb 4 oz (99.5 kg)  07/29/21 214 lb (97.1 kg)    Ocular: Sclerae unicteric, pupils equal, round and reactive to light Ear-nose-throat: Oropharynx clear, dentition fair Lymphatic: No cervical or supraclavicular adenopathy Lungs no rales or rhonchi, good excursion bilaterally Heart regular rate and rhythm, no murmur appreciated Abd soft, nontender, positive bowel sounds MSK no focal spinal tenderness, no joint  edema Neuro: non-focal, well-oriented, appropriate affect Breasts: Deferred   Lab Results  Component Value Date   WBC 8.4 10/15/2021   HGB 10.8 (L) 10/15/2021   HCT 33.5 (L) 10/15/2021   MCV 83.8 10/15/2021   PLT 333 10/15/2021   Lab Results  Component Value Date   FERRITIN 12.2 (L) 08/29/2021   IRON 46 08/29/2021   TIBC 519.4 (H) 08/29/2021   UIBC 276 10/17/2020   IRONPCTSAT 8.9 (L) 08/29/2021   Lab Results  Component Value Date   RETICCTPCT 2.1 10/15/2021   RBC 3.97 (L) 10/15/2021   No results found for: "KPAFRELGTCHN", "LAMBDASER", "KAPLAMBRATIO" No results  found for: "IGGSERUM", "IGA", "IGMSERUM" No results found for: "TOTALPROTELP", "ALBUMINELP", "A1GS", "A2GS", "BETS", "BETA2SER", "GAMS", "MSPIKE", "SPEI"   Chemistry      Component Value Date/Time   NA 134 (L) 07/29/2021 0758   NA 137 05/07/2020 0901   K 4.7 07/29/2021 0758   CL 95 (L) 07/29/2021 0758   CO2 27 07/29/2021 0758   BUN 18 07/29/2021 0758   BUN 16 05/07/2020 0901   CREATININE 0.92 07/29/2021 0758   CREATININE 0.90 09/20/2019 1519   CREATININE 0.97 08/30/2013 0910      Component Value Date/Time   CALCIUM 9.9 07/29/2021 0758   ALKPHOS 116 07/29/2021 0758   AST 33 07/29/2021 0758   AST 47 (H) 09/20/2019 1519   ALT 32 07/29/2021 0758   ALT 38 09/20/2019 1519   BILITOT 0.5 07/29/2021 0758   BILITOT 0.5 09/20/2019 1519       Impression and Plan: William Shea is a very pleasant 56 yo caucasian gentleman with iron deficiency anemia felt to be due to intermittent GI blood loss.  Iron studies pending.  Follow-up in 6 months.   Lottie Dawson, NP 7/12/20233:17 PM

## 2021-10-16 LAB — IRON AND IRON BINDING CAPACITY (CC-WL,HP ONLY)
Iron: 25 ug/dL — ABNORMAL LOW (ref 45–182)
Saturation Ratios: 5 % — ABNORMAL LOW (ref 17.9–39.5)
TIBC: 554 ug/dL — ABNORMAL HIGH (ref 250–450)
UIBC: 529 ug/dL — ABNORMAL HIGH (ref 117–376)

## 2021-10-17 ENCOUNTER — Telehealth: Payer: Self-pay

## 2021-10-17 ENCOUNTER — Other Ambulatory Visit: Payer: Self-pay | Admitting: Family

## 2021-10-17 NOTE — Telephone Encounter (Signed)
-----   Message from Volanda Napoleon, MD sent at 10/16/2021  8:44 PM EDT ----- Call - the iron is very low again.  He needs IV Iron.  Is he bleeding??  When was last colonoscopy??    Laurey Arrow

## 2021-10-17 NOTE — Telephone Encounter (Signed)
Advised via MyChart.

## 2021-10-27 ENCOUNTER — Inpatient Hospital Stay: Payer: No Typology Code available for payment source

## 2021-10-27 VITALS — BP 134/75 | HR 63 | Temp 98.4°F | Resp 17

## 2021-10-27 DIAGNOSIS — D509 Iron deficiency anemia, unspecified: Secondary | ICD-10-CM | POA: Diagnosis not present

## 2021-10-27 DIAGNOSIS — D500 Iron deficiency anemia secondary to blood loss (chronic): Secondary | ICD-10-CM

## 2021-10-27 MED ORDER — HEPARIN SOD (PORK) LOCK FLUSH 100 UNIT/ML IV SOLN: 250.0000 [IU] | Freq: Once | INTRAVENOUS | Status: DC | PRN

## 2021-10-27 MED ORDER — SODIUM CHLORIDE 0.9 % IV SOLN: Freq: Once | INTRAVENOUS | Status: AC

## 2021-10-27 MED ORDER — SODIUM CHLORIDE 0.9 % IV SOLN
300.0000 mg | Freq: Once | INTRAVENOUS | Status: AC
  Administered 2021-10-27: 300 mg via INTRAVENOUS
  Filled 2021-10-27: qty 300

## 2021-10-27 MED ORDER — SODIUM CHLORIDE 0.9% FLUSH: 3.0000 mL | Freq: Once | INTRAVENOUS | Status: DC | PRN

## 2021-10-27 MED ORDER — ALTEPLASE 2 MG IJ SOLR: 2.0000 mg | Freq: Once | INTRAMUSCULAR | Status: DC | PRN

## 2021-10-27 MED ORDER — HEPARIN SOD (PORK) LOCK FLUSH 100 UNIT/ML IV SOLN: 500.0000 [IU] | Freq: Once | INTRAVENOUS | Status: DC | PRN

## 2021-10-27 MED ORDER — SODIUM CHLORIDE 0.9% FLUSH: 10.0000 mL | Freq: Once | INTRAVENOUS | Status: DC | PRN

## 2021-11-03 ENCOUNTER — Inpatient Hospital Stay: Payer: No Typology Code available for payment source

## 2021-11-04 ENCOUNTER — Inpatient Hospital Stay: Payer: No Typology Code available for payment source | Attending: Hematology & Oncology

## 2021-11-04 VITALS — BP 144/77 | HR 59 | Temp 98.4°F | Resp 20

## 2021-11-04 DIAGNOSIS — D500 Iron deficiency anemia secondary to blood loss (chronic): Secondary | ICD-10-CM

## 2021-11-04 DIAGNOSIS — D509 Iron deficiency anemia, unspecified: Secondary | ICD-10-CM | POA: Insufficient documentation

## 2021-11-04 MED ORDER — SODIUM CHLORIDE 0.9 % IV SOLN
300.0000 mg | Freq: Once | INTRAVENOUS | Status: AC
  Administered 2021-11-04: 300 mg via INTRAVENOUS
  Filled 2021-11-04: qty 300

## 2021-11-04 MED ORDER — SODIUM CHLORIDE 0.9 % IV SOLN: Freq: Once | INTRAVENOUS | Status: AC

## 2021-11-04 NOTE — Patient Instructions (Signed)

## 2021-11-10 ENCOUNTER — Inpatient Hospital Stay: Payer: No Typology Code available for payment source

## 2021-11-10 VITALS — BP 112/65 | HR 88 | Temp 97.9°F | Resp 18

## 2021-11-10 DIAGNOSIS — D509 Iron deficiency anemia, unspecified: Secondary | ICD-10-CM | POA: Diagnosis not present

## 2021-11-10 DIAGNOSIS — D500 Iron deficiency anemia secondary to blood loss (chronic): Secondary | ICD-10-CM

## 2021-11-10 MED ORDER — SODIUM CHLORIDE 0.9 % IV SOLN: Freq: Once | INTRAVENOUS | Status: AC

## 2021-11-10 MED ORDER — SODIUM CHLORIDE 0.9 % IV SOLN
300.0000 mg | Freq: Once | INTRAVENOUS | Status: AC
  Administered 2021-11-10: 300 mg via INTRAVENOUS
  Filled 2021-11-10: qty 300

## 2021-11-10 NOTE — Patient Instructions (Signed)

## 2021-11-24 ENCOUNTER — Telehealth: Payer: Self-pay | Admitting: Gastroenterology

## 2021-11-24 NOTE — Telephone Encounter (Signed)
Received a call from patient.  He states he was told by Dr. Silverio Decamp that he had a bleeding hemorrhoid that she could fix for him.  He would like to have some more information regarding this procedure...how long it takes to heal, how he should wipe, etc.  Please call patient and advise.  Thank you.

## 2021-11-25 NOTE — Telephone Encounter (Signed)
Called the patient to discuss hemorrhoid banding procedure and post care. No answer. Left a message to check his My Chart. I will send the information through the portal.

## 2021-12-08 ENCOUNTER — Other Ambulatory Visit: Payer: Self-pay | Admitting: Family Medicine

## 2021-12-08 DIAGNOSIS — F4323 Adjustment disorder with mixed anxiety and depressed mood: Secondary | ICD-10-CM

## 2022-01-13 ENCOUNTER — Other Ambulatory Visit: Payer: Self-pay | Admitting: Family Medicine

## 2022-01-16 ENCOUNTER — Telehealth: Payer: Self-pay | Admitting: Family Medicine

## 2022-01-16 NOTE — Telephone Encounter (Signed)
Called the patient left detailed message to schedule Pre-Op appointment for upcoming surger.

## 2022-01-19 NOTE — Telephone Encounter (Signed)
Called left message to call back to schedule Pre-op appointment with Dr. Nani Ravens

## 2022-01-20 NOTE — Telephone Encounter (Signed)
Patient called back and scheduled appointment on 02/09/2022.

## 2022-01-24 ENCOUNTER — Other Ambulatory Visit: Payer: Self-pay | Admitting: Family Medicine

## 2022-02-09 ENCOUNTER — Ambulatory Visit: Payer: No Typology Code available for payment source | Admitting: Family Medicine

## 2022-02-20 ENCOUNTER — Other Ambulatory Visit: Payer: Self-pay | Admitting: Family Medicine

## 2022-03-09 ENCOUNTER — Other Ambulatory Visit: Payer: Self-pay | Admitting: Family Medicine

## 2022-03-09 DIAGNOSIS — F4323 Adjustment disorder with mixed anxiety and depressed mood: Secondary | ICD-10-CM

## 2022-04-02 ENCOUNTER — Encounter: Payer: Self-pay | Admitting: Family

## 2022-04-06 DEATH — deceased

## 2022-04-09 ENCOUNTER — Encounter (HOSPITAL_COMMUNITY): Admission: RE | Admit: 2022-04-09 | Payer: No Typology Code available for payment source | Source: Ambulatory Visit

## 2022-04-17 ENCOUNTER — Inpatient Hospital Stay: Payer: Self-pay | Admitting: Family

## 2022-04-17 ENCOUNTER — Inpatient Hospital Stay: Payer: Self-pay | Attending: Hematology & Oncology

## 2022-05-01 ENCOUNTER — Telehealth: Payer: Self-pay | Admitting: Family Medicine

## 2022-05-01 NOTE — Telephone Encounter (Signed)
Called the patient left message to call back to schedule Pre-Op appointment for surgery on 429/2024

## 2022-05-04 NOTE — Telephone Encounter (Signed)
Called left message to cal back

## 2022-05-04 NOTE — Telephone Encounter (Signed)
Called left detailed message to call back to schedule pre-op appt.

## 2022-05-05 NOTE — Telephone Encounter (Signed)
Called left message to call back 

## 2022-05-06 NOTE — Telephone Encounter (Signed)
Called left detailed message to call back to schedule Pre--op appointment.

## 2022-05-08 NOTE — Telephone Encounter (Signed)
Called left detailed message to call back to schedule Pre-op appt with PCP

## 2022-05-11 NOTE — Telephone Encounter (Signed)
Called left detailed message (on cell/work number) to call back.

## 2022-05-11 NOTE — Telephone Encounter (Signed)
Sent the patient a mychart message to get this appointment scheduled to complete the pre-op form

## 2022-06-17 ENCOUNTER — Telehealth: Payer: Self-pay | Admitting: Family Medicine

## 2022-06-17 NOTE — Telephone Encounter (Signed)
[facility] called to notify us they found out pt died the week of Christmas, they called his wife and found out today.

## 2022-06-17 NOTE — Telephone Encounter (Signed)
I think he may have died at the hospital as the rep from [facility] said she notified the hospital and they just marked him as deceased.

## 2022-08-03 ENCOUNTER — Ambulatory Visit: Admit: 2022-08-03 | Payer: No Typology Code available for payment source | Admitting: Orthopedic Surgery

## 2022-08-03 SURGERY — ARTHROPLASTY, KNEE, TOTAL
Anesthesia: Choice | Site: Knee | Laterality: Left

## 2022-08-08 IMAGING — CT CT HEART MORP W/ CTA COR W/ SCORE W/ CA W/CM &/OR W/O CM
4 of 7 series · 8 of 20 positions shown, 9 images · IV contrast (APPLIED)
Comparison: None.
COMPARISON: None.
COMPARISON: None.

Addendum:
EXAM:
OVER-READ INTERPRETATION  CT CHEST

The following report is an over-read performed by radiologist Dr.
Maurits Temiz [REDACTED] on 07/30/2020. This
over-read does not include interpretation of cardiac or coronary
anatomy or pathology. The coronary calcium score/coronary CTA
interpretation by the cardiologist is attached.
TECHNIQUE: The patient was scanned on a Phillips Force scanner.
CLINICAL DATA: Typical AP while anemic, now asymptomatic

[Series 6: best diast · axial · 0.39mm/px · z∈[-185,-146]mm · 2 of 293 slices shown, 3 images]
[im 98/293  vessel]
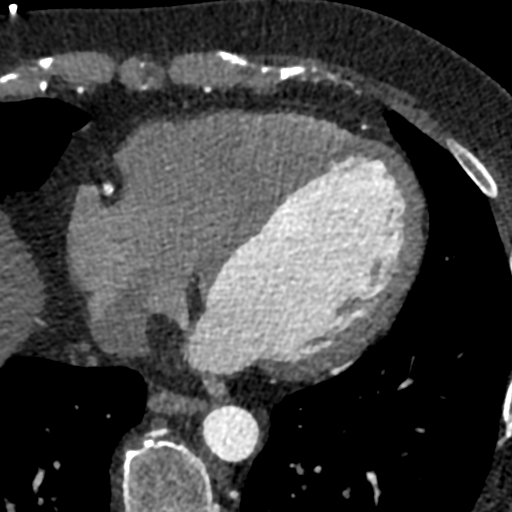
[im 98/293  lung]
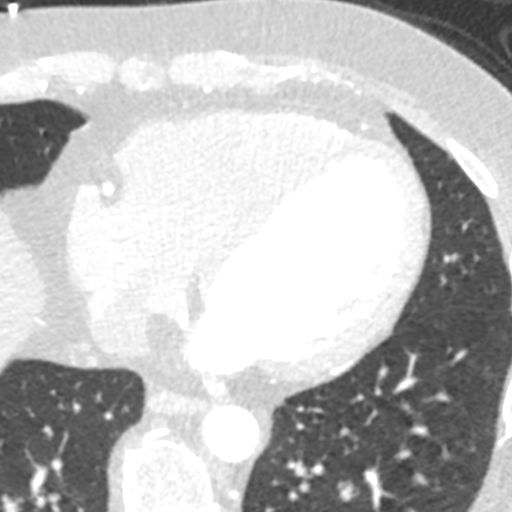
[im 195/293  vessel]
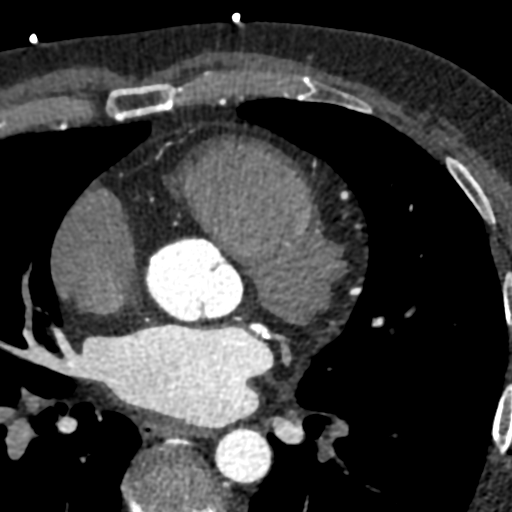

[Series 7: best syst · axial · 0.39mm/px · z∈[-185,-146]mm · 2 of 293 slices shown]
[im 98/293  vessel]
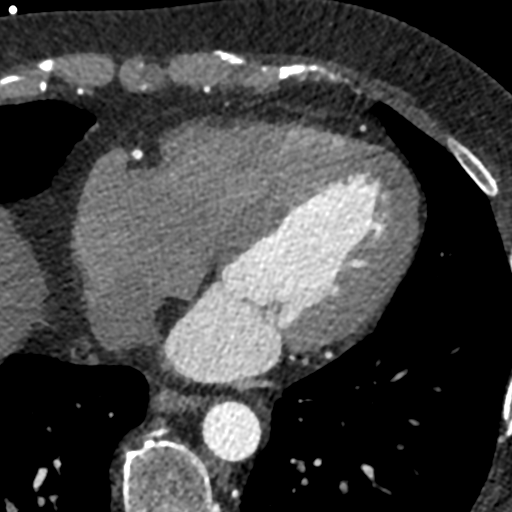
[im 195/293  vessel]
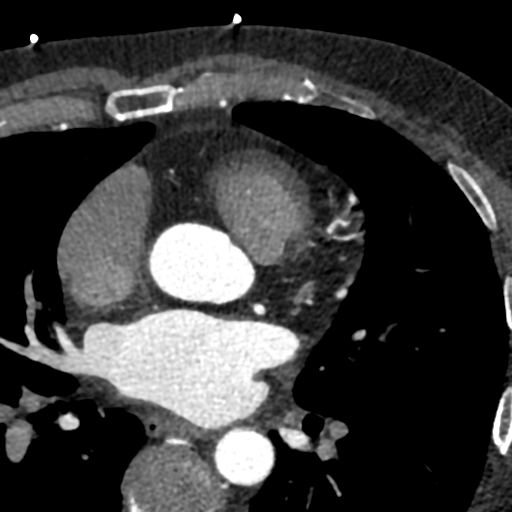

[Series 8: ts diast sharp 74 % · axial · 0.39mm/px · z∈[-185,-146]mm · 2 of 293 slices shown]
[im 98/293  lung]
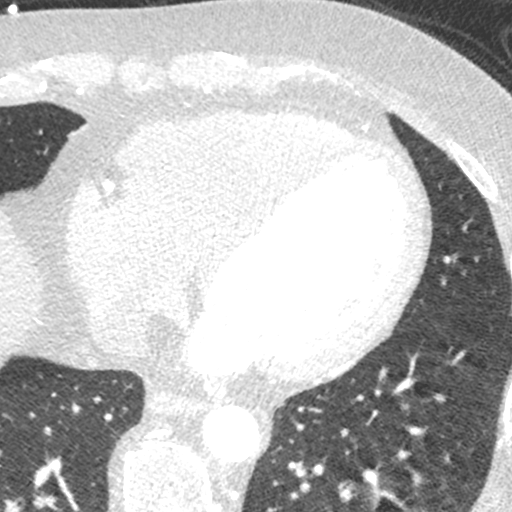
[im 195/293  lung]
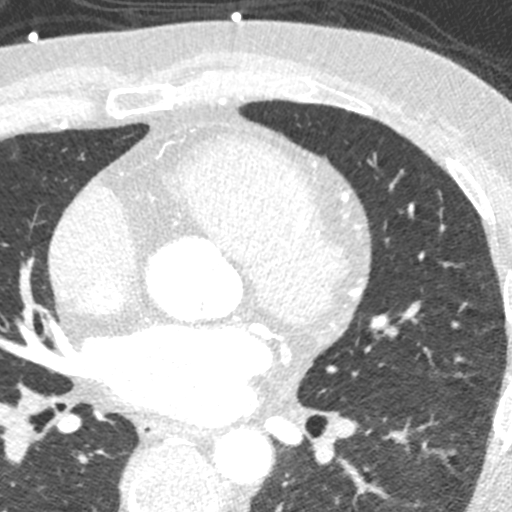

[Series 9: ts syst sharp · axial · 0.39mm/px · z∈[-185,-146]mm · 2 of 293 slices shown]
[im 98/293  lung]
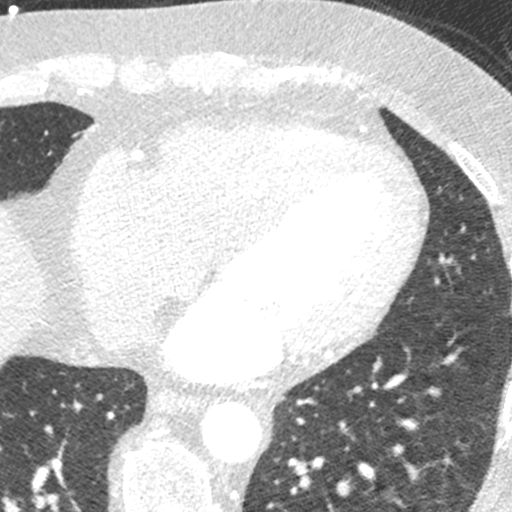
[im 195/293  lung]
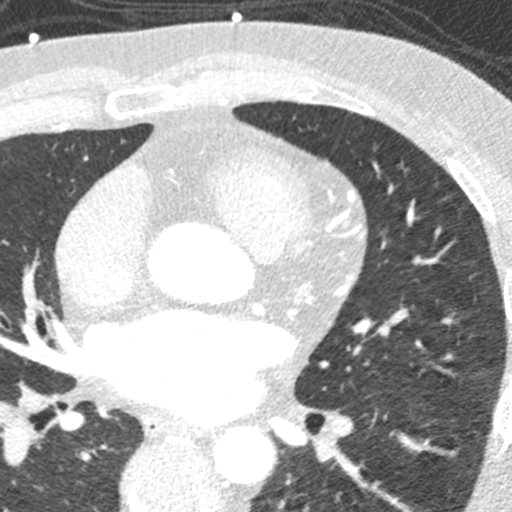

[8 of 20 positions shown; findings below may reference images not displayed]

FINDINGS: Atherosclerotic calcifications in the thoracic aorta. Within the
visualized portions of the thorax there are no suspicious appearing
pulmonary nodules or masses, there is no acute consolidative
airspace disease, no pleural effusions, no pneumothorax and no
lymphadenopathy. Visualized portions of the upper abdomen are
unremarkable. There are no aggressive appearing lytic or blastic
lesions noted in the visualized portions of the skeleton.
IMPRESSION: 1.  Aortic Atherosclerosis (WECHD-MAR.R).

EXAM:
Cardiac/Coronary  CTA
FINDINGS: A 100 kV prospective scan was triggered in the descending thoracic
aorta at 111 HU's. Axial non-contrast 3 mm slices were carried out
through the heart. The data set was analyzed on a dedicated work
station and scored using the Agatson method. Gantry rotation speed
was 250 msecs and collimation was .6 mm. No beta blockade and 0.8 mg
of sl NTG was given. The 3D data set was reconstructed in 5%
intervals of the 67-82 % of the R-R cycle. Diastolic phases were
analyzed on a dedicated work station using MPR, MIP and VRT modes.
The patient received 80 cc of contrast.

Aorta:  Normal size.  No calcifications.  No dissection.

Aortic Valve:  Trileaflet.  Mild calcifications noted.

Coronary Arteries:  Normal coronary origin.  Right dominance.

RCA is a large dominant artery that gives rise to PDA and PLA. There
are multiple mixed plaques noted with moderate stenosis (50-70%).

Left main is a large artery that gives rise to LAD and LCX arteries.

Left mail is short and has mild non obstructive calcified plaque.

LAD is a large vessel. In its proximal portion there is mild non
obstructive calcified plaque. In the mid portion of the LAD there
are multiple mixed plaques with moderate stenosis of 50-70%. LAD
gives rise to large D1, small D2 and moderate size D3. These
branches do not have significant plaques.

LCX is a non-dominant artery that gives rise to one small OM1 and
Large OM2 branches. In the mid portion of EURIDICE artery there are
multiple mixed plaques with moderate (50-70% stenosis).

Other findings:

Normal pulmonary vein drainage into the left atrium. Middle right
pulmonary vein noted.

Normal left atrial appendage without a thrombus.

Normal size of the pulmonary artery.
IMPRESSION: 1. Coronary calcium score of 9384. This was 99 percentile for age
and sex matched control.

2. Normal coronary origin with right dominance.

3. CAD-RADS 3. Moderate stenosis. Consider symptom-guided
anti-ischemic pharmacotherapy as well as risk factor modification
per guideline directed care. Additional analysis with CT FFR will be
submitted.

EXAM:
CT FFR ANALYSIS
FINDINGS: FFRct analysis was performed on the original cardiac CT angiogram
dataset. Diagrammatic representation of the FFRct analysis is
provided in a separate PDF document in PACS. This dictation was
created using the PDF document and an interactive 3D model of the
results. 3D model is not available in the EMR/PACS. Normal FFR range
is >0.80.

1. Left Main: 0.98  No significant stenosis.

2. LAD: Prox 0.98, mid 0.8, distal 0.74.
3. LCX: Prox 0.98, mid 0.86, distal
4. RCA: Prox 0.98, mid 0.91, distal
IMPRESSION: 1. CT FFR analysis showed hemodynamically significant stenosis in
distal LAD and LCX

*** End of Addendum ***
Addendum:
EXAM:
OVER-READ INTERPRETATION  CT CHEST

The following report is an over-read performed by radiologist Dr.
Maurits Temiz [REDACTED] on 07/30/2020. This
over-read does not include interpretation of cardiac or coronary
anatomy or pathology. The coronary calcium score/coronary CTA
interpretation by the cardiologist is attached.
FINDINGS: Atherosclerotic calcifications in the thoracic aorta. Within the
visualized portions of the thorax there are no suspicious appearing
pulmonary nodules or masses, there is no acute consolidative
airspace disease, no pleural effusions, no pneumothorax and no
lymphadenopathy. Visualized portions of the upper abdomen are
unremarkable. There are no aggressive appearing lytic or blastic
lesions noted in the visualized portions of the skeleton.
IMPRESSION: 1.  Aortic Atherosclerosis (WECHD-MAR.R).

EXAM:
Cardiac/Coronary  CTA
FINDINGS: A 100 kV prospective scan was triggered in the descending thoracic
aorta at 111 HU's. Axial non-contrast 3 mm slices were carried out
through the heart. The data set was analyzed on a dedicated work
station and scored using the Agatson method. Gantry rotation speed
was 250 msecs and collimation was .6 mm. No beta blockade and 0.8 mg
of sl NTG was given. The 3D data set was reconstructed in 5%
intervals of the 67-82 % of the R-R cycle. Diastolic phases were
analyzed on a dedicated work station using MPR, MIP and VRT modes.
The patient received 80 cc of contrast.

Aorta:  Normal size.  No calcifications.  No dissection.

Aortic Valve:  Trileaflet.  Mild calcifications noted.

Coronary Arteries:  Normal coronary origin.  Right dominance.

RCA is a large dominant artery that gives rise to PDA and PLA. There
are multiple mixed plaques noted with moderate stenosis (50-70%).

Left main is a large artery that gives rise to LAD and LCX arteries.

Left mail is short and has mild non obstructive calcified plaque.

LAD is a large vessel. In its proximal portion there is mild non
obstructive calcified plaque. In the mid portion of the LAD there
are multiple mixed plaques with moderate stenosis of 50-70%. LAD
gives rise to large D1, small D2 and moderate size D3. These
branches do not have significant plaques.

LCX is a non-dominant artery that gives rise to one small OM1 and
Large OM2 branches. In the mid portion of [place]ID[place] artery there are
multiple mixed plaques with moderate (50-70% stenosis).

Other findings:

Normal pulmonary vein drainage into the left atrium. Middle right
pulmonary vein noted.

Normal left atrial appendage without a thrombus.

Normal size of the pulmonary artery.
IMPRESSION: 1. Coronary calcium score of 9384. This was 99 percentile for age
and sex matched control.

2. Normal coronary origin with right dominance.

3. CAD-RADS 3. Moderate stenosis. Consider symptom-guided
anti-ischemic pharmacotherapy as well as risk factor modification
per guideline directed care. Additional analysis with CT FFR will be
submitted.

*** End of Addendum ***
EXAM:
OVER-READ INTERPRETATION  CT CHEST

The following report is an over-read performed by radiologist Dr.
Maurits Temiz [REDACTED] on 07/30/2020. This
over-read does not include interpretation of cardiac or coronary
anatomy or pathology. The coronary calcium score/coronary CTA
interpretation by the cardiologist is attached.
FINDINGS: Atherosclerotic calcifications in the thoracic aorta. Within the
visualized portions of the thorax there are no suspicious appearing
pulmonary nodules or masses, there is no acute consolidative
airspace disease, no pleural effusions, no pneumothorax and no
lymphadenopathy. Visualized portions of the upper abdomen are
unremarkable. There are no aggressive appearing lytic or blastic
lesions noted in the visualized portions of the skeleton.
IMPRESSION: 1.  Aortic Atherosclerosis (WECHD-MAR.R).

## 2023-02-06 IMAGING — CR DG ANKLE COMPLETE 3+V*R*
3 series · 3 of 3 positions shown · non-contrast
Comparison: None.

CLINICAL DATA: Distal medial tibial pain after trauma. Dropped a
piece of metal onto right ankle 1 week ago with persistent pain and
bruising.

EXAM:
RIGHT ANKLE - COMPLETE 3+ VIEW

[t ankle joint ap right]
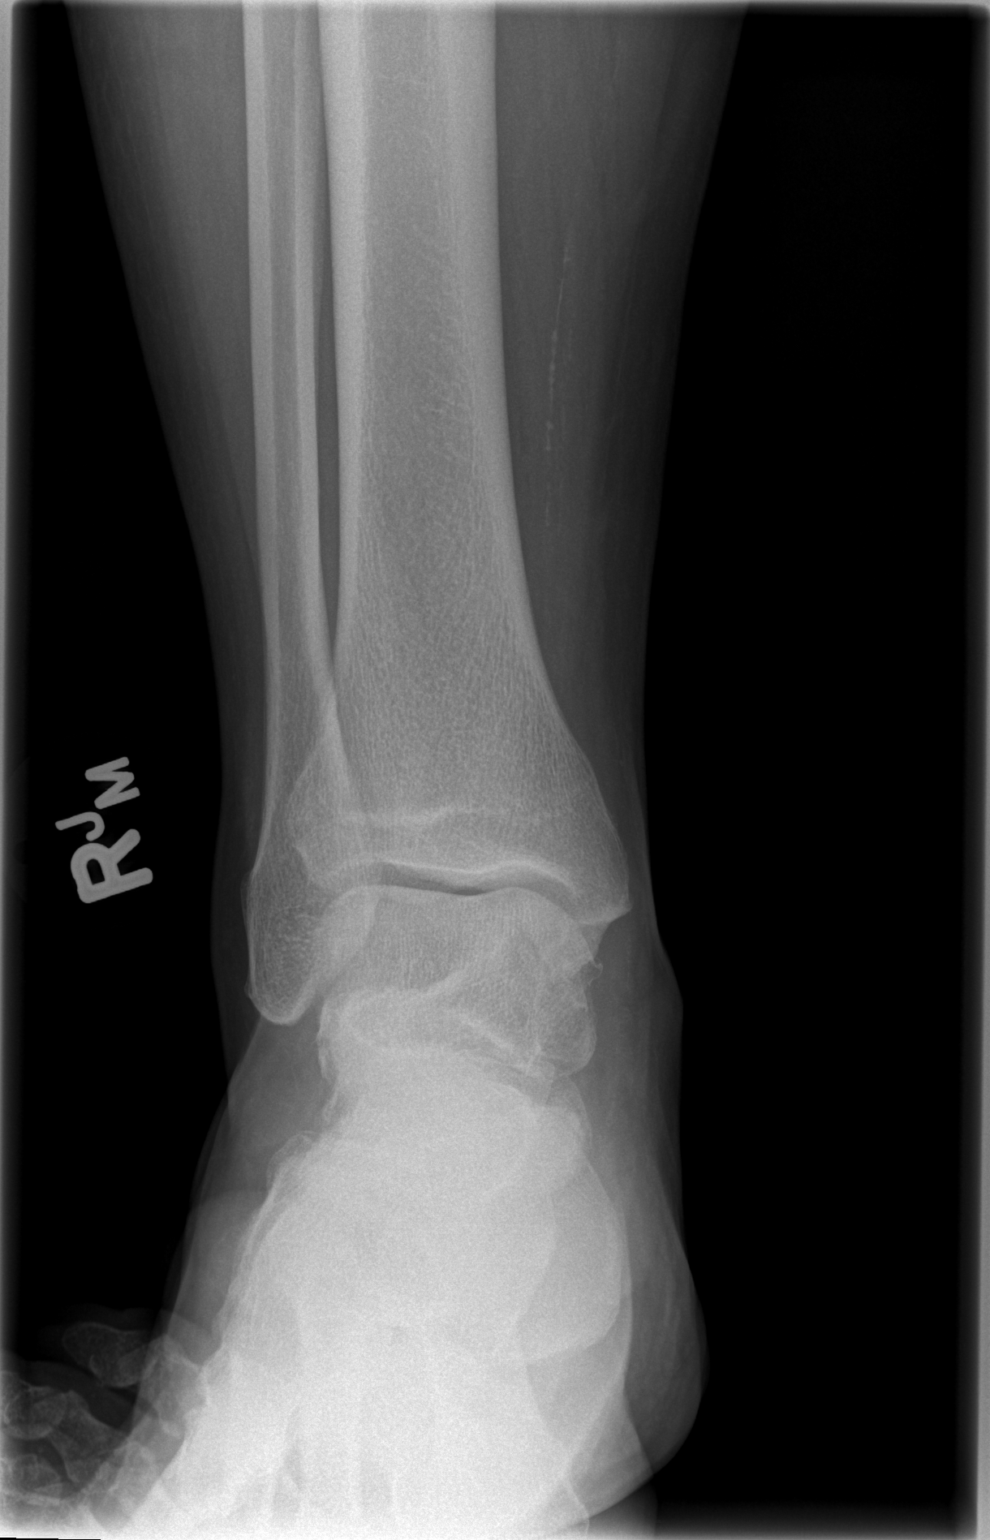

[t ankle joint oblique right]
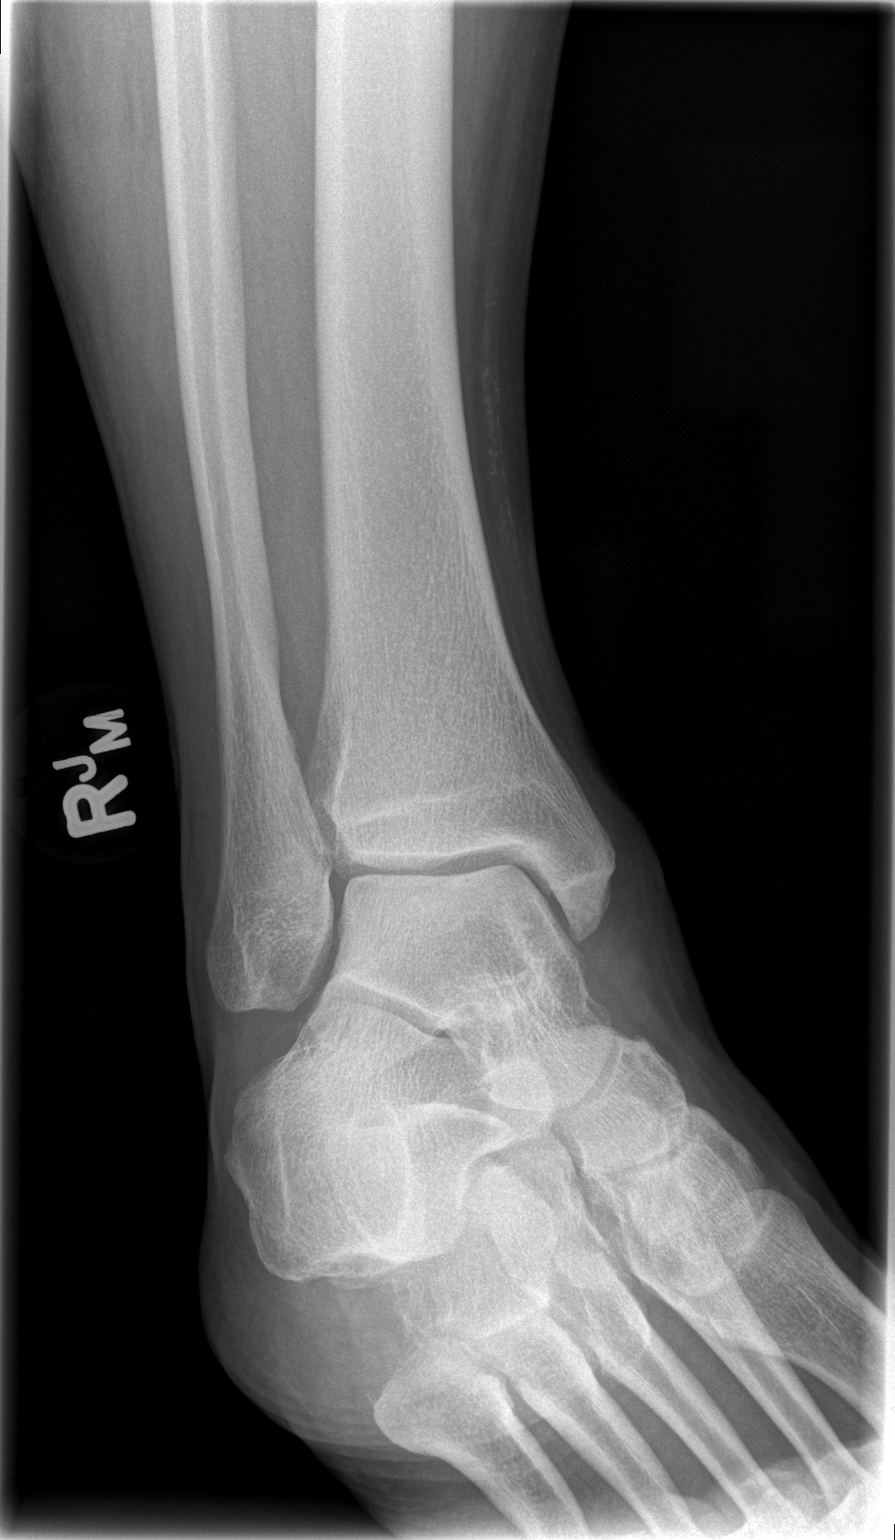

[t ankle joint lat right]
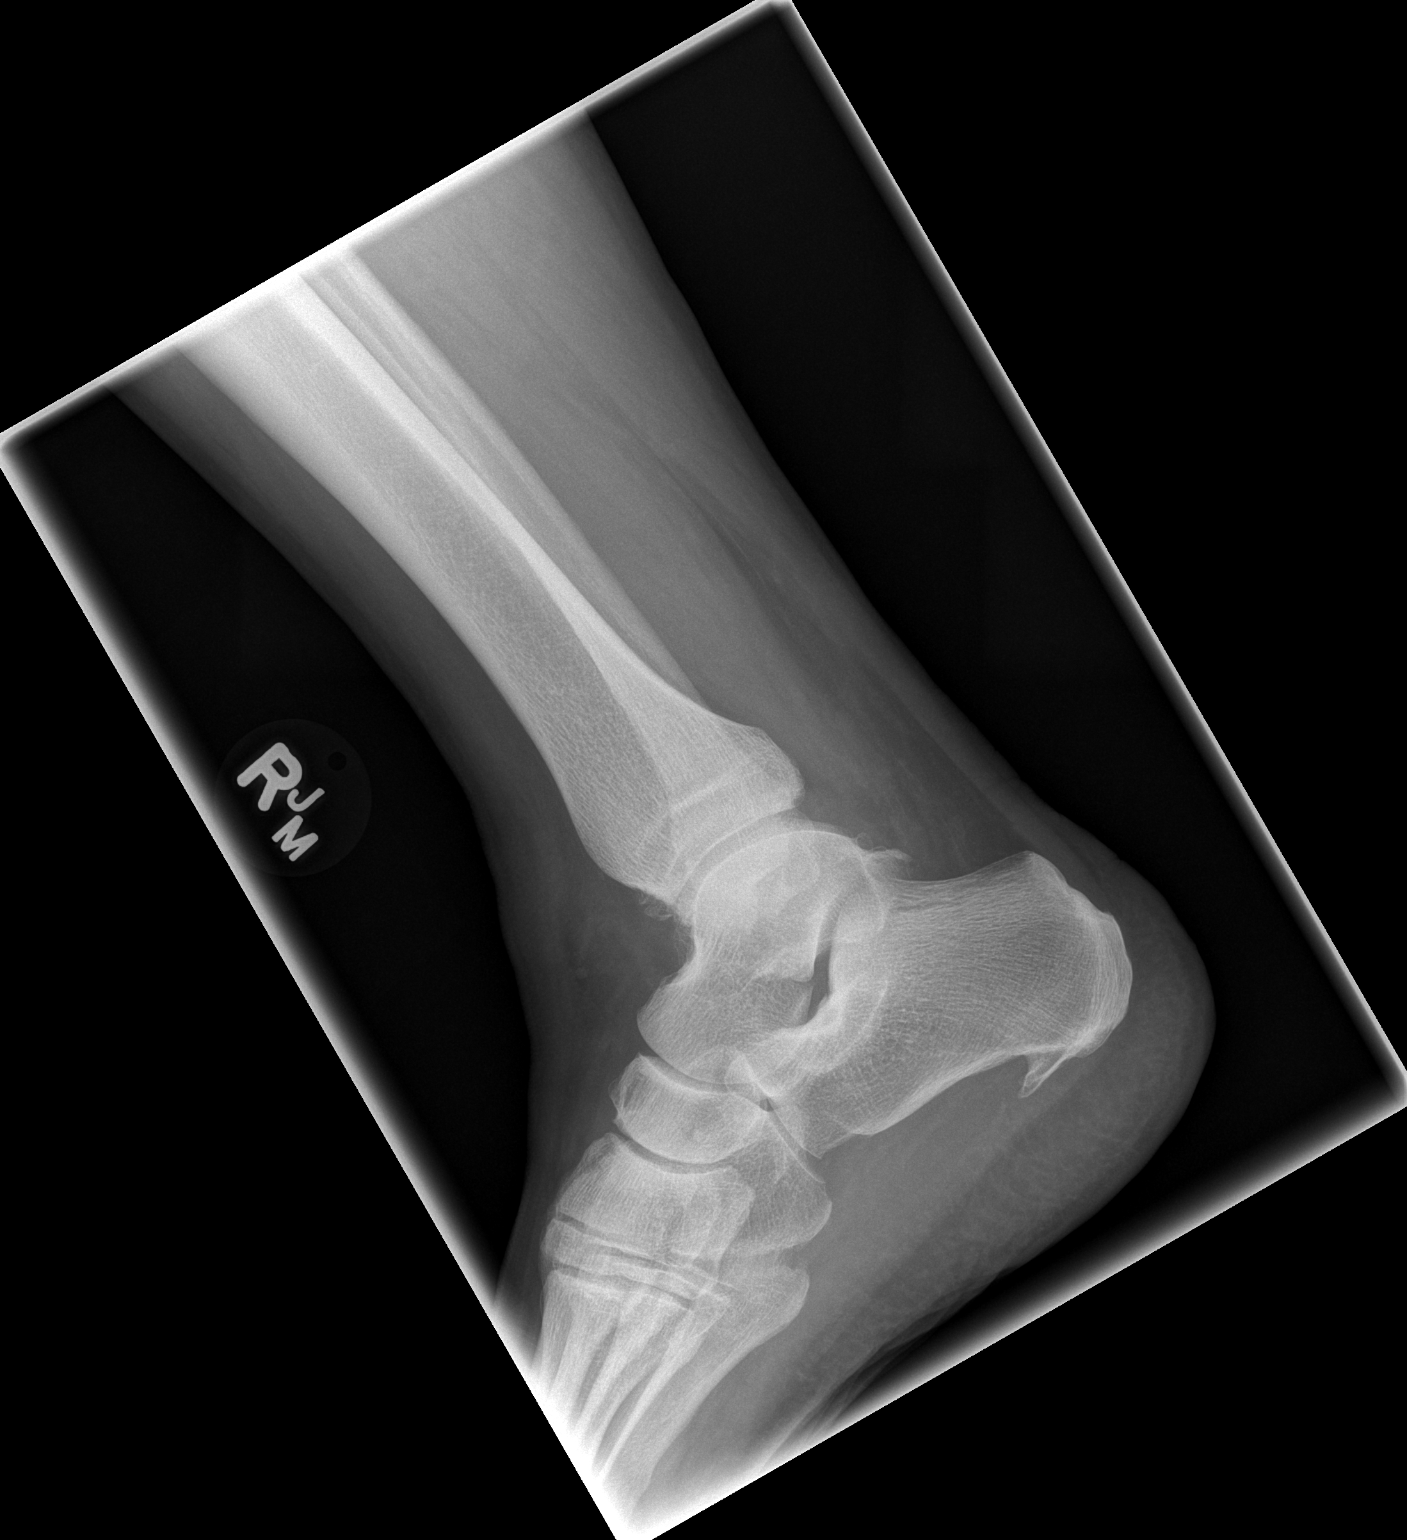

[3 of 3 positions shown; findings below may reference images not displayed]

FINDINGS: No evidence of acute fracture. Well corticated osseous densities
adjacent to the anterior distal tibia at the ankle joint appear
chronic. No periosteal reaction. The ankle mortise is preserved.
There is no ankle joint effusion. Moderate plantar calcaneal spur.
Mild generalized soft tissue edema.
IMPRESSION: 1. No acute fracture or subluxation of the right ankle.
2. Moderate plantar calcaneal spur.
